# Patient Record
Sex: Female | Born: 1960 | Race: White | Hispanic: No | Marital: Married | Smoking: Never smoker
Health system: Southern US, Community
[De-identification: ages and names within clinical notes are randomized; demographics above are authoritative.]

## PROBLEM LIST (undated history)

## (undated) DIAGNOSIS — I10 Essential (primary) hypertension: Secondary | ICD-10-CM

## (undated) HISTORY — DX: Essential (primary) hypertension: I10

## (undated) HISTORY — PX: POLYPECTOMY: SHX149

## (undated) HISTORY — PX: BREAST BIOPSY: SHX20

---

## 2012-12-09 LAB — HM PAP SMEAR: HM Pap smear: NORMAL

## 2012-12-09 LAB — HM MAMMOGRAPHY: HM Mammogram: NORMAL

## 2012-12-22 ENCOUNTER — Encounter: Payer: Self-pay | Admitting: Gastroenterology

## 2012-12-25 ENCOUNTER — Other Ambulatory Visit: Payer: Self-pay | Admitting: Obstetrics and Gynecology

## 2012-12-25 DIAGNOSIS — R928 Other abnormal and inconclusive findings on diagnostic imaging of breast: Secondary | ICD-10-CM

## 2013-01-05 ENCOUNTER — Ambulatory Visit
Admission: RE | Admit: 2013-01-05 | Discharge: 2013-01-05 | Disposition: A | Discharge status: Home / Self Care | Payer: BC Managed Care – PPO | Source: Ambulatory Visit | Attending: Obstetrics and Gynecology | Admitting: Obstetrics and Gynecology

## 2013-01-05 ENCOUNTER — Other Ambulatory Visit: Payer: Self-pay | Admitting: Obstetrics and Gynecology

## 2013-01-05 DIAGNOSIS — R928 Other abnormal and inconclusive findings on diagnostic imaging of breast: Secondary | ICD-10-CM

## 2013-01-08 ENCOUNTER — Ambulatory Visit
Admission: RE | Admit: 2013-01-08 | Discharge: 2013-01-08 | Disposition: A | Discharge status: Home / Self Care | Payer: BC Managed Care – PPO | Source: Ambulatory Visit | Attending: Obstetrics and Gynecology | Admitting: Obstetrics and Gynecology

## 2013-01-08 ENCOUNTER — Other Ambulatory Visit: Payer: Self-pay | Admitting: Obstetrics and Gynecology

## 2013-01-08 DIAGNOSIS — R928 Other abnormal and inconclusive findings on diagnostic imaging of breast: Secondary | ICD-10-CM

## 2013-01-14 ENCOUNTER — Encounter: Payer: Self-pay | Admitting: Gastroenterology

## 2013-01-14 ENCOUNTER — Ambulatory Visit (AMBULATORY_SURGERY_CENTER): Payer: BC Managed Care – PPO | Admitting: *Deleted

## 2013-01-14 VITALS — Ht 63.25 in | Wt 176.0 lb

## 2013-01-14 DIAGNOSIS — Z1211 Encounter for screening for malignant neoplasm of colon: Secondary | ICD-10-CM

## 2013-01-14 MED ORDER — PEG-KCL-NACL-NASULF-NA ASC-C 100 G PO SOLR: ORAL | Status: DC

## 2013-01-14 NOTE — Progress Notes (Signed)
No egg or soy allergy 

## 2013-01-28 ENCOUNTER — Ambulatory Visit (AMBULATORY_SURGERY_CENTER): Payer: BC Managed Care – PPO | Admitting: Gastroenterology

## 2013-01-28 ENCOUNTER — Encounter: Payer: Self-pay | Admitting: Gastroenterology

## 2013-01-28 VITALS — BP 122/75 | HR 66 | Temp 97.7°F | Resp 15 | Ht 63.0 in | Wt 176.0 lb

## 2013-01-28 DIAGNOSIS — Z1211 Encounter for screening for malignant neoplasm of colon: Secondary | ICD-10-CM

## 2013-01-28 DIAGNOSIS — D126 Benign neoplasm of colon, unspecified: Secondary | ICD-10-CM

## 2013-01-28 HISTORY — PX: COLONOSCOPY: SHX174

## 2013-01-28 MED ORDER — SODIUM CHLORIDE 0.9 % IV SOLN: 500.0000 mL | INTRAVENOUS | Status: DC

## 2013-01-28 NOTE — Op Note (Signed)
[facility] [address] [place], [address]   COLONOSCOPY PROCEDURE REPORT  PATIENT: Jody Thompson, Jody Thompson  MR#: [id number] BIRTHDATE: Mar 07, 1961 , 51  yrs. old GENDER: Female ENDOSCOPIST: Meryl Dare, MD, [facility] REFERRED FT:[id number] Renaldo Fiddler, M.D. PROCEDURE DATE:  01/28/2013 PROCEDURE:   Colonoscopy with snare polypectomy ASA CLASS:   Class II INDICATIONS:average risk screening. MEDICATIONS: MAC sedation, administered by CRNA and propofol (Diprivan) 300mg  IV DESCRIPTION OF PROCEDURE:   After the risks benefits and alternatives of the procedure were thoroughly explained, informed consent was obtained.  A digital rectal exam revealed no abnormalities of the rectum.   The LB CF-[id number]  endoscope was introduced through the anus and advanced to the cecum, which was identified by both the appendix and ileocecal valve. No adverse events experienced.   The quality of the prep was excellent, using MoviPrep  The instrument was then slowly withdrawn as the colon was fully examined.  COLON FINDINGS: A sessile polyp measuring 6 mm in size was found in the transverse colon.  A polypectomy was performed with a cold snare.  The resection was complete and the polyp tissue was completely retrieved.   The colon was otherwise normal.  There was no diverticulosis, inflammation, polyps or cancers unless previously stated.  Retroflexed views revealed small internal hemorrhoids. The time to cecum=3 minutes 10 seconds.  Withdrawal time=9 minutes 05 seconds.  The scope was withdrawn and the procedure completed.  COMPLICATIONS: There were no complications.  ENDOSCOPIC IMPRESSION: 1.   Sessile polyp measuring 6 mm in the transverse colon; polypectomy performed with a cold snare 2.   Small internal hemorrhoids  RECOMMENDATIONS: 1.  Await pathology results 2.  Repeat colonoscopy in 5 years if polyp adenomatous; otherwise 10 years   eSigned:  Meryl Dare, MD, [facility]  01/28/2013 11:31 AM

## 2013-01-28 NOTE — Progress Notes (Signed)
Patient did not experience any of the following events: a burn prior to discharge; a fall within the facility; wrong site/side/patient/procedure/implant event; or a hospital transfer or hospital admission upon discharge from the facility. (G8907) Patient did not have preoperative order for IV antibiotic SSI prophylaxis. (G8918)  

## 2013-01-28 NOTE — Patient Instructions (Addendum)
Findings:  Polyp, Small internal hemorrhoids Recommendations:  Repeat colonoscopy in 5-10 years depending on pathology.  YOU HAD AN ENDOSCOPIC PROCEDURE TODAY AT THE [facility]: Refer to the procedure report that was given to you for any specific questions about what was found during the examination.  If the procedure report does not answer your questions, please call your gastroenterologist to clarify.  If you requested that your care partner not be given the details of your procedure findings, then the procedure report has been included in a sealed envelope for you to review at your convenience later.  YOU SHOULD EXPECT: Some feelings of bloating in the abdomen. Passage of more gas than usual.  Walking can help get rid of the air that was put into your GI tract during the procedure and reduce the bloating. If you had a lower endoscopy (such as a colonoscopy or flexible sigmoidoscopy) you may notice spotting of blood in your stool or on the toilet paper. If you underwent a bowel prep for your procedure, then you may not have a normal bowel movement for a few days.  DIET: Your first meal following the procedure should be a light meal and then it is ok to progress to your normal diet.  A half-sandwich or bowl of soup is an example of a good first meal.  Heavy or fried foods are harder to digest and may make you feel nauseous or bloated.  Likewise meals heavy in dairy and vegetables can cause extra gas to form and this can also increase the bloating.  Drink plenty of fluids but you should avoid alcoholic beverages for 24 hours.  ACTIVITY: Your care partner should take you home directly after the procedure.  You should plan to take it easy, moving slowly for the rest of the day.  You can resume normal activity the day after the procedure however you should NOT DRIVE or use heavy machinery for 24 hours (because of the sedation medicines used during the test).    SYMPTOMS TO REPORT  IMMEDIATELY: A gastroenterologist can be reached at any hour.  During normal business hours, 8:30 AM to 5:00 PM Monday through Friday, call [phone].  After hours and on weekends, please call the GI answering service at [phone] who will take a message and have the physician on call contact you.   Following lower endoscopy (colonoscopy or flexible sigmoidoscopy):  Excessive amounts of blood in the stool  Significant tenderness or worsening of abdominal pains  Swelling of the abdomen that is new, acute  Fever of 100F or higher  Following upper endoscopy (EGD)  Vomiting of blood or coffee ground material  New chest pain or pain under the shoulder blades  Painful or persistently difficult swallowing  New shortness of breath  Fever of 100F or higher  Black, tarry-looking stools  FOLLOW UP: If any biopsies were taken you will be contacted by phone or by letter within the next 1-3 weeks.  Call your gastroenterologist if you have not heard about the biopsies in 3 weeks.  Our staff will call the home number listed on your records the next business day following your procedure to check on you and address any questions or concerns that you may have at that time regarding the information given to you following your procedure. This is a courtesy call and so if there is no answer at the home number and we have not heard from you through the emergency physician on call, we will assume  that you have returned to your regular daily activities without incident.  SIGNATURES/CONFIDENTIALITY: You and/or your care partner have signed paperwork which will be entered into your electronic medical record.  These signatures attest to the fact that that the information above on your After Visit Summary has been reviewed and is understood.  Full responsibility of the confidentiality of this discharge information lies with you and/or your care-partner.  Please follow all discharge instructions given to you  by the recovery room nurse. If you have any questions or problems after discharge please call one of the numbers listed above. You will receive a phone call in the am to see how you are doing and answer any questions you may have. Thank you for choosing [facility] for your health care needs.

## 2013-01-29 ENCOUNTER — Telehealth: Payer: Self-pay | Admitting: *Deleted

## 2013-01-29 NOTE — Telephone Encounter (Signed)
  Follow up Call-  Call back number 01/28/2013  Post procedure Call Back phone  # [phone]  Permission to leave phone message Yes     Patient questions:  Do you have a fever, pain , or abdominal swelling? no Pain Score  0 *  Have you tolerated food without any problems? yes  Have you been able to return to your normal activities? yes  Do you have any questions about your discharge instructions: Diet   no Medications  no Follow up visit  no  Do you have questions or concerns about your Care? no  Actions: * If pain score is 4 or above: No action needed, pain <4.

## 2013-02-03 ENCOUNTER — Encounter: Payer: Self-pay | Admitting: Gastroenterology

## 2013-11-17 ENCOUNTER — Ambulatory Visit (INDEPENDENT_AMBULATORY_CARE_PROVIDER_SITE_OTHER): Payer: BC Managed Care – PPO | Admitting: Internal Medicine

## 2013-11-17 ENCOUNTER — Encounter: Payer: Self-pay | Admitting: Internal Medicine

## 2013-11-17 VITALS — BP 126/84 | HR 80 | Temp 98.2°F | Ht 66.0 in | Wt 172.8 lb

## 2013-11-17 DIAGNOSIS — Z23 Encounter for immunization: Secondary | ICD-10-CM

## 2013-11-17 DIAGNOSIS — I10 Essential (primary) hypertension: Secondary | ICD-10-CM | POA: Insufficient documentation

## 2013-11-17 DIAGNOSIS — Z0000 Encounter for general adult medical examination without abnormal findings: Secondary | ICD-10-CM

## 2013-11-17 DIAGNOSIS — R030 Elevated blood-pressure reading, without diagnosis of hypertension: Secondary | ICD-10-CM

## 2013-11-17 NOTE — Patient Instructions (Addendum)
It was good to see you today.  We have reviewed your prior records including labs and tests today  Health Maintenance reviewed - Tdap ( tetanus, diphtheria and pertussis) immunization updated today -consider flu vaccination annually -all other recommended immunizations and age-appropriate screenings are up-to-date.  Test(s) ordered today. Your results will be released to MyChart (or called to you) after review, usually within 72hours after test completion. If any changes need to be made, you will be notified at that same time.  Medications reviewed and updated, no changes recommended at this time.  Work on lifestyle changes as discussed (low fat, low carb, increased protein diet; improved exercise efforts; weight loss) to control sugar, blood pressure and cholesterol levels and/or reduce risk of developing other medical problems. Look into [email or web address] or other type of food journal to assist you in this process.  Please schedule followup in 12-24 months for annual exam, call sooner if problems.  Health Maintenance, Female A healthy lifestyle and preventative care can promote health and wellness.  Maintain regular health, dental, and eye exams.  Eat a healthy diet. Foods like vegetables, fruits, whole grains, low-fat dairy products, and lean protein foods contain the nutrients you need without too many calories. Decrease your intake of foods high in solid fats, added sugars, and salt. Get information about a proper diet from your caregiver, if necessary.  Regular physical exercise is one of the most important things you can do for your health. Most adults should get at least 150 minutes of moderate-intensity exercise (any activity that increases your heart rate and causes you to sweat) each week. In addition, most adults need muscle-strengthening exercises on 2 or more days a week.   Maintain a healthy weight. The body mass index (BMI) is a screening tool to identify possible weight  problems. It provides an estimate of body fat based on height and weight. Your caregiver can help determine your BMI, and can help you achieve or maintain a healthy weight. For adults 20 years and older:  A BMI below 18.5 is considered underweight.  A BMI of 18.5 to 24.9 is normal.  A BMI of 25 to 29.9 is considered overweight.  A BMI of 30 and above is considered obese.  Maintain normal blood lipids and cholesterol by exercising and minimizing your intake of saturated fat. Eat a balanced diet with plenty of fruits and vegetables. Blood tests for lipids and cholesterol should begin at age 42 and be repeated every 5 years. If your lipid or cholesterol levels are high, you are over 50, or you are a high risk for heart disease, you may need your cholesterol levels checked more frequently.Ongoing high lipid and cholesterol levels should be treated with medicines if diet and exercise are not effective.  If you smoke, find out from your caregiver how to quit. If you do not use tobacco, do not start.  Lung cancer screening is recommended for adults aged 33 80 years who are at high risk for developing lung cancer because of a history of smoking. Yearly low-dose computed tomography (CT) is recommended for people who have at least a 30-pack-year history of smoking and are a current smoker or have quit within the past 15 years. A pack year of smoking is smoking an average of 1 pack of cigarettes a day for 1 year (for example: 1 pack a day for 30 years or 2 packs a day for 15 years). Yearly screening should continue until the smoker has stopped smoking for at least  15 years. Yearly screening should also be stopped for people who develop a health problem that would prevent them from having lung cancer treatment.  If you are pregnant, do not drink alcohol. If you are breastfeeding, be very cautious about drinking alcohol. If you are not pregnant and choose to drink alcohol, do not exceed 1 drink per day. One  drink is considered to be 12 ounces (355 mL) of beer, 5 ounces (148 mL) of wine, or 1.5 ounces (44 mL) of liquor.  Avoid use of street drugs. Do not share needles with anyone. Ask for help if you need support or instructions about stopping the use of drugs.  High blood pressure causes heart disease and increases the risk of stroke. Blood pressure should be checked at least every 1 to 2 years. Ongoing high blood pressure should be treated with medicines, if weight loss and exercise are not effective.  If you are 48 to 51 years old, ask your caregiver if you should take aspirin to prevent strokes.  Diabetes screening involves taking a blood sample to check your fasting blood sugar level. This should be done once every 3 years, after age 65, if you are within normal weight and without risk factors for diabetes. Testing should be considered at a younger age or be carried out more frequently if you are overweight and have at least 1 risk factor for diabetes.  Breast cancer screening is essential preventative care for women. You should practice "breast self-awareness." This means understanding the normal appearance and feel of your breasts and may include breast self-examination. Any changes detected, no matter how small, should be reported to a caregiver. Women in their 50s and 30s should have a clinical breast exam (CBE) by a caregiver as part of a regular health exam every 1 to 3 years. After age 96, women should have a CBE every year. Starting at age 30, women should consider having a mammogram (breast X-ray) every year. Women who have a family history of breast cancer should talk to their caregiver about genetic screening. Women at a high risk of breast cancer should talk to their caregiver about having an MRI and a mammogram every year.  Breast cancer gene (BRCA)-related cancer risk assessment is recommended for women who have family members with BRCA-related cancers. BRCA-related cancers include breast,  ovarian, tubal, and peritoneal cancers. Having family members with these cancers may be associated with an increased risk for harmful changes (mutations) in the breast cancer genes BRCA1 and BRCA2. Results of the assessment will determine the need for genetic counseling and BRCA1 and BRCA2 testing.  The Pap test is a screening test for cervical cancer. Women should have a Pap test starting at age 11. Between ages 78 and 43, Pap tests should be repeated every 2 years. Beginning at age 4, you should have a Pap test every 3 years as long as the past 3 Pap tests have been normal. If you had a hysterectomy for a problem that was not cancer or a condition that could lead to cancer, then you no longer need Pap tests. If you are between ages 47 and 18, and you have had normal Pap tests going back 10 years, you no longer need Pap tests. If you have had past treatment for cervical cancer or a condition that could lead to cancer, you need Pap tests and screening for cancer for at least 20 years after your treatment. If Pap tests have been discontinued, risk factors (such as a new  sexual partner) need to be reassessed to determine if screening should be resumed. Some women have medical problems that increase the chance of getting cervical cancer. In these cases, your caregiver may recommend more frequent screening and Pap tests.  The human papillomavirus (HPV) test is an additional test that may be used for cervical cancer screening. The HPV test looks for the virus that can cause the cell changes on the cervix. The cells collected during the Pap test can be tested for HPV. The HPV test could be used to screen women aged 24 years and older, and should be used in women of any age who have unclear Pap test results. After the age of 12, women should have HPV testing at the same frequency as a Pap test.  Colorectal cancer can be detected and often prevented. Most routine colorectal cancer screening begins at the age of 52  and continues through age 20. However, your caregiver may recommend screening at an earlier age if you have risk factors for colon cancer. On a yearly basis, your caregiver may provide home test kits to check for hidden blood in the stool. Use of a small camera at the end of a tube, to directly examine the colon (sigmoidoscopy or colonoscopy), can detect the earliest forms of colorectal cancer. Talk to your caregiver about this at age 24, when routine screening begins. Direct examination of the colon should be repeated every 5 to 10 years through age 4, unless early forms of pre-cancerous polyps or small growths are found.  Hepatitis C blood testing is recommended for all people born from [birth year] through 1964 and any individual with known risks for hepatitis C.  Practice safe sex. Use condoms and avoid high-risk sexual practices to reduce the spread of sexually transmitted infections (STIs). Sexually active women aged 26 and younger should be checked for Chlamydia, which is a common sexually transmitted infection. Older women with new or multiple partners should also be tested for Chlamydia. Testing for other STIs is recommended if you are sexually active and at increased risk.  Osteoporosis is a disease in which the bones lose minerals and strength with aging. This can result in serious bone fractures. The risk of osteoporosis can be identified using a bone density scan. Women ages 5 and over and women at risk for fractures or osteoporosis should discuss screening with their caregivers. Ask your caregiver whether you should be taking a calcium supplement or vitamin D to reduce the rate of osteoporosis.  Menopause can be associated with physical symptoms and risks. Hormone replacement therapy is available to decrease symptoms and risks. You should talk to your caregiver about whether hormone replacement therapy is right for you.  Use sunscreen. Apply sunscreen liberally and repeatedly throughout the day.  You should seek shade when your shadow is shorter than you. Protect yourself by wearing long sleeves, pants, a wide-brimmed hat, and sunglasses year round, whenever you are outdoors.  Notify your caregiver of new moles or changes in moles, especially if there is a change in shape or color. Also notify your caregiver if a mole is larger than the size of a pencil eraser.  Stay current with your immunizations. Document Released: 06/10/2011 Document Revised: 03/22/2013 Document Reviewed: 06/10/2011 [facility] Patient Information 2014 [place], [place].

## 2013-11-17 NOTE — Progress Notes (Signed)
Subjective:    Patient ID: Jody Thompson, female    DOB: 28-May-1961, 52 y.o.   MRN: [id number]  HPI  New patient to me, here to establish primary care patient is here today for annual physical. Patient feels well and has no complaints.  History reviewed. No pertinent past medical history.  Family History  Problem Relation Age of Onset  . Colon cancer Paternal Grandmother   . Esophageal cancer Neg Hx   . Stomach cancer Neg Hx   . Rectal cancer Neg Hx   . Arthritis Mother   . Heart disease Mother   . Hypertension Father   . Breast cancer Maternal Aunt    History  Substance Use Topics  . Smoking status: Never Smoker   . Smokeless tobacco: Never Used  . Alcohol Use: 4.2 oz/week    7 Glasses of wine per week    Review of Systems  Constitutional: Negative for fatigue and unexpected weight change.  Respiratory: Negative for cough, shortness of breath and wheezing.   Cardiovascular: Negative for chest pain, palpitations and leg swelling.  Gastrointestinal: Negative for nausea, abdominal pain and diarrhea.  Neurological: Negative for dizziness, weakness, light-headedness and headaches.  Psychiatric/Behavioral: Negative for dysphoric mood. The patient is not nervous/anxious.   All other systems reviewed and are negative.       Objective:   Physical Exam BP 126/84  Pulse 80  Temp(Src) 98.2 F (36.8 C) (Oral)  Ht 5\' 6"  (1.676 m)  Wt 172 lb 12.8 oz (78.382 kg)  BMI 27.90 kg/m2  SpO2 96% Wt Readings from Last 3 Encounters:  11/17/13 172 lb 12.8 oz (78.382 kg)  01/28/13 176 lb (79.833 kg)  01/14/13 176 lb (79.833 kg)   Constitutional: She appears well-developed and well-nourished. No distress.  HENT: Head: Normocephalic and atraumatic. Ears: B TMs ok, no erythema or effusion; Nose: Nose normal. Mouth/Throat: Oropharynx is clear and moist. No oropharyngeal exudate.  Eyes: Conjunctivae and EOM are normal. Pupils are equal, round, and reactive to light. No scleral  icterus.  Neck: Normal range of motion. Neck supple. No JVD present. No thyromegaly present.  Cardiovascular: Normal rate, regular rhythm and normal heart sounds.  No murmur heard. No BLE edema. Pulmonary/Chest: Effort normal and breath sounds normal. No respiratory distress. She has no wheezes.  Abdominal: Soft. Bowel sounds are normal. She exhibits no distension. There is no tenderness. no masses Musculoskeletal: Normal range of motion, no joint effusions. No gross deformities Neurological: She is alert and oriented to person, place, and time. No cranial nerve deficit. Coordination, balance, strength, speech and gait are normal.  Skin: Skin is warm and dry. No rash noted. No erythema.  Psychiatric: She has a normal mood and affect. Her behavior is normal. Judgment and thought content normal.   No results found for this basename: WBC,  HGB,  HCT,  PLT,  GLUCOSE,  CHOL,  TRIG,  HDL,  LDLDIRECT,  LDLCALC,  ALT,  AST,  NA,  K,  CL,  CREATININE,  BUN,  CO2,  TSH,  PSA,  INR,  GLUF,  HGBA1C,  MICROALBUR   ECG: normal sinus at 89 beats per minute. No arrhythmia or ischemic change     Assessment & Plan:   CPX/v70.0 - Patient has been counseled on age-appropriate routine health concerns for screening and prevention. These are reviewed and up-to-date. Immunizations are up-to-date or declined. Labs and ECG reviewed.  ?whitecoat hypertension -initial blood pressure 162/90 normalized to 126/84 on manual recheck. Reviewed influence of weight  gain and sodium on blood pressure elevation. Also reviewed syndrome of whitecoat hypertension. Patient will monitor at home and call if systolic pressure remains consistently 140 or greater. Aerobic exercise, weight reduction and low sodium diet recommended

## 2013-11-17 NOTE — Progress Notes (Signed)
Pre-visit discussion using our clinic review tool. No additional management support is needed unless otherwise documented below in the visit note.  

## 2014-06-01 IMAGING — MG MM DIAGNOSTIC UNILATERAL L
2 series · 2 of 2 positions shown · non-contrast
Comparison: 12/21/2012, 11/19/2010, 11/17/2008

CLINICAL DATA: Further evaluation of left breast asymmetry

DIGITAL DIAGNOSTIC LEFT MAMMOGRAM  AND LEFT BREAST ULTRASOUND:

[L CC]
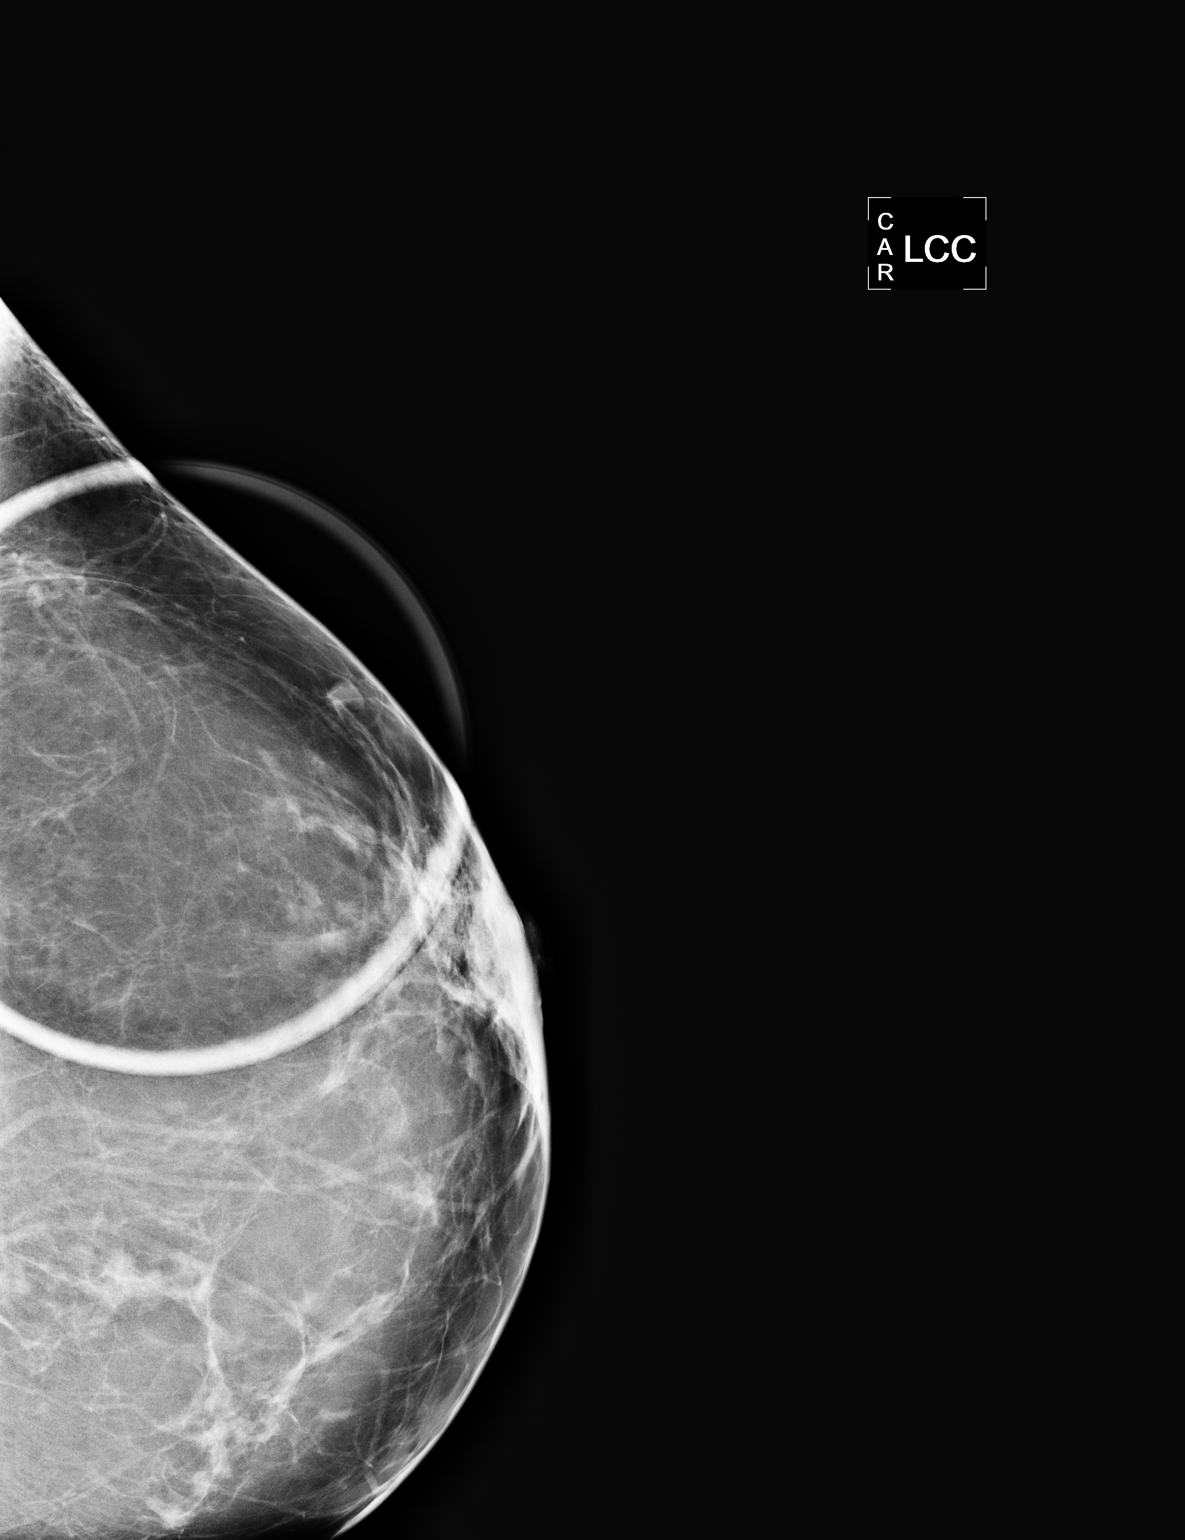

[L MLO]
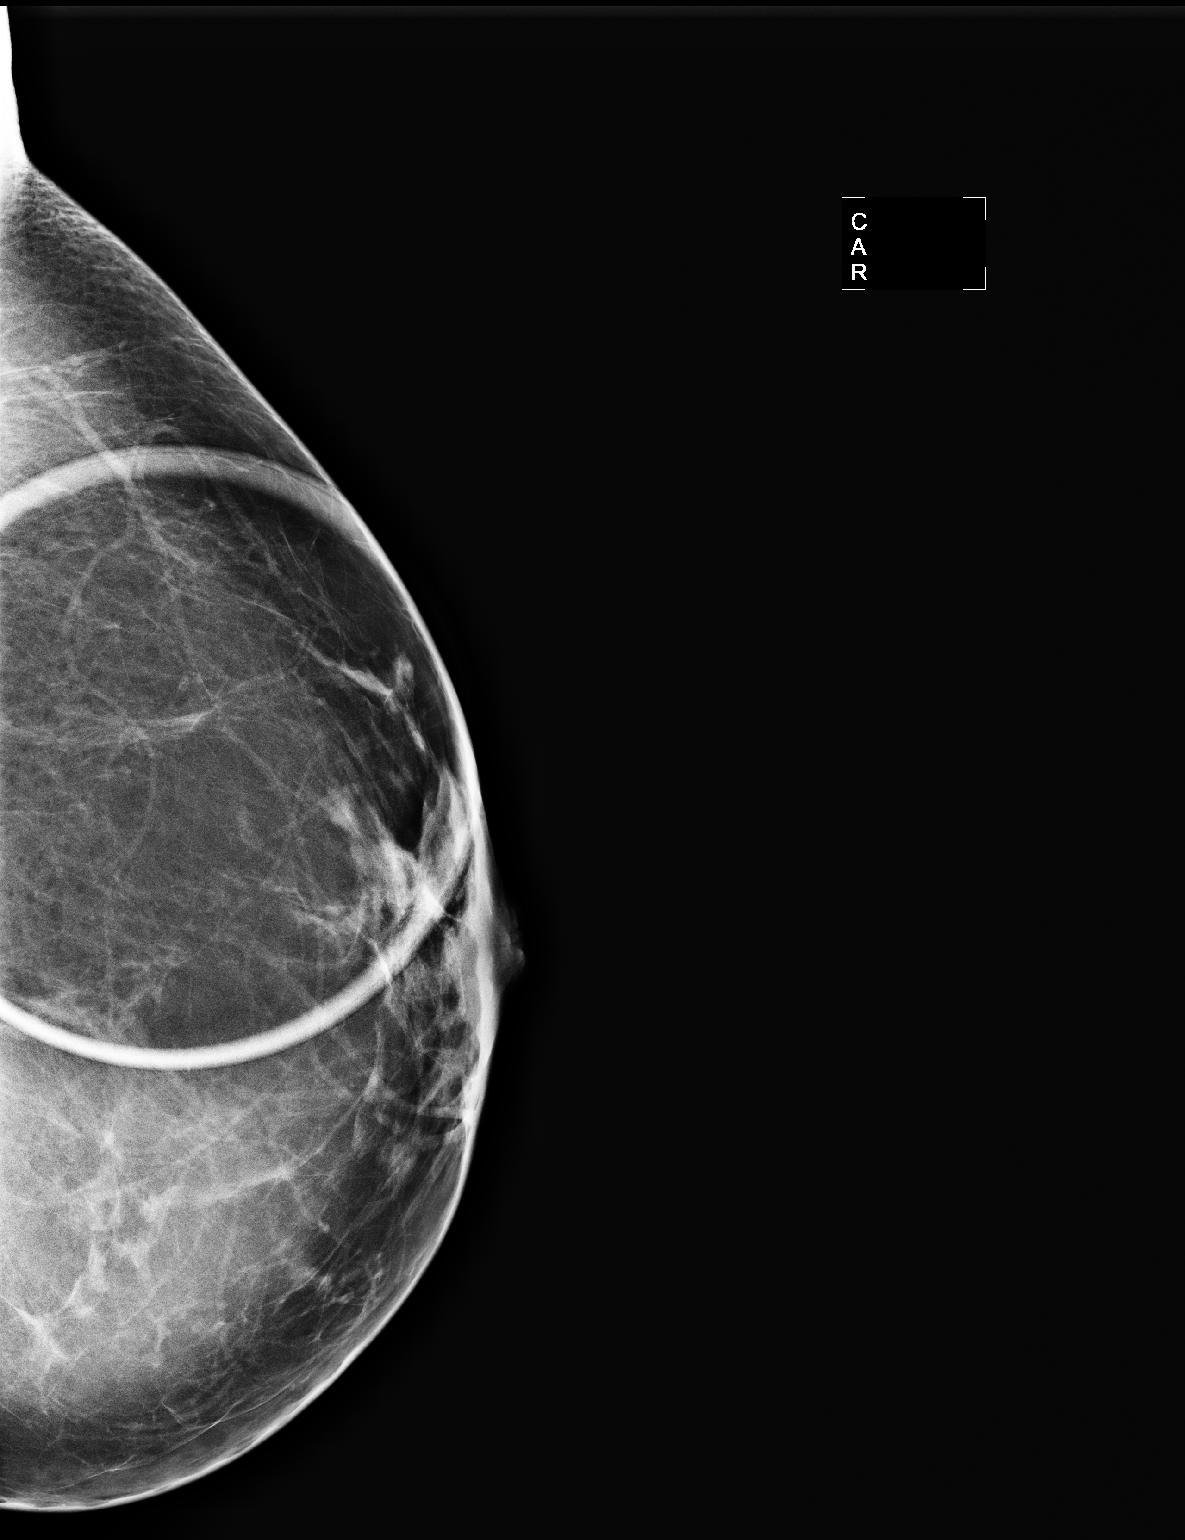

[2 of 2 positions shown; findings below may reference images not displayed]

FINDINGS: ACR Breast Density Category 2: There is a scattered fibroglandular
pattern.

There is a persistent circumscribed oval 5 mm mass anteriorly in
the 2 o'clock position of the left breast.

On physical exam, there are no palpable abnormalities.

Ultrasound is performed, showing an oval hypoechoic mass in the 2
o'clock position of the left breast 2 cm from the nipple.  It is
located superficially within the breast.  It measures 6 x 3 x 5 mm.
It is microlobulated and causes posterior acoustic shadowing.
IMPRESSION: Small left breast mass

BI-RADS CATEGORY 4:  Suspicious abnormality - biopsy should be
considered.

RECOMMENDATION:
The patient is scheduled to return for ultrasound-guided core
needle biopsy.

I have discussed the findings and recommendations with the patient.
Results were also provided in writing at the conclusion of the
visit.

## 2014-06-04 IMAGING — US US BREAST BIOPSY
1 series · 9 of 9 positions shown · non-contrast
Comparison: Previous exams.

***ADDENDUM*** CREATED: 01/11/2013 [DATE]

Pathology revealed benign breast tissue with patchy stromal
hyalinization and pseudoangiomatous stromal hyalinization (PASH) in
the left breast. This was found to be concordant by Dr. Lovesten
Tischer. Pathology was relayed by telephone. The patient reported
minimal tenderness at the biopsy site. Post biopsy instructions
were reviewed and her questions were answered. She was encouraged
to call The [REDACTED] for any additional
concerns. She was asked to return in 6 months for a diagnostic left
breast mammogram and possible ultrasound.
Pathology results are dictated by Edgar Emmanuel Willian RN, BSN on [place]
CLINICAL DATA: Suspicious mass left breast two o'clock location
ULTRASOUND GUIDED VACUUM ASSISTED CORE BIOPSY OF THE LEFT BREAST

[Series 1: us breast biopsy · 9 of 9 slices shown]
[im 1/9]
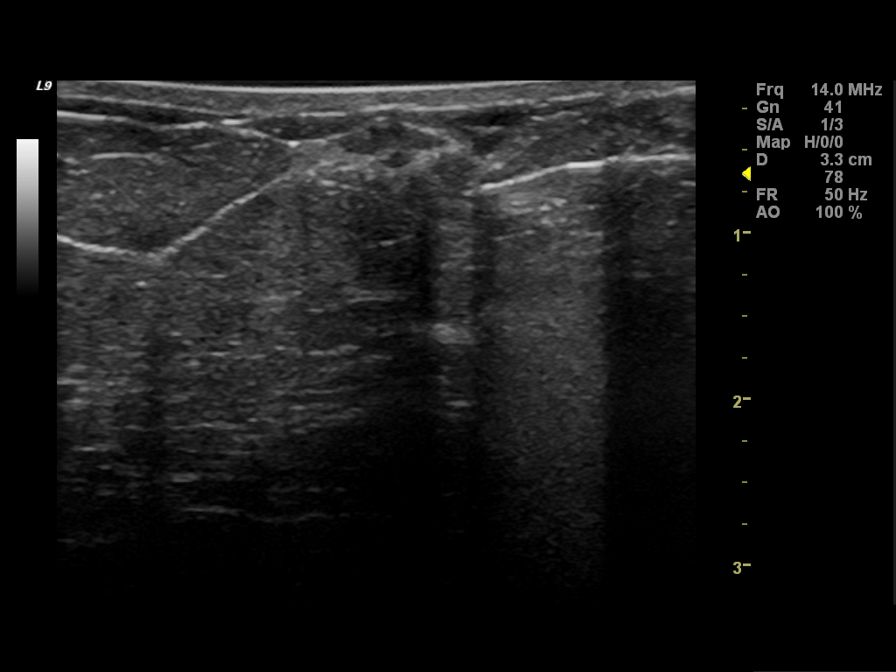
[im 2/9]
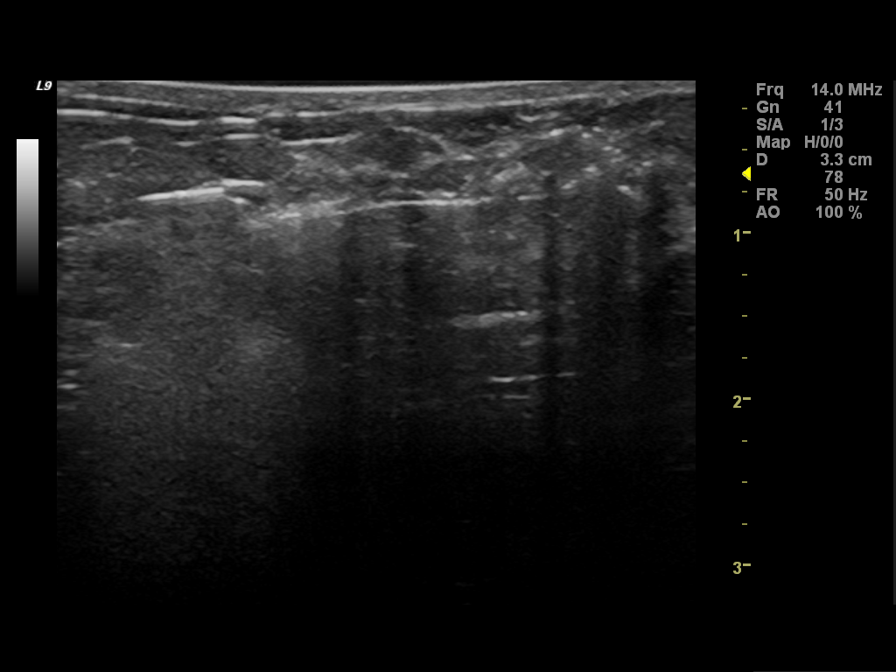
[im 3/9]
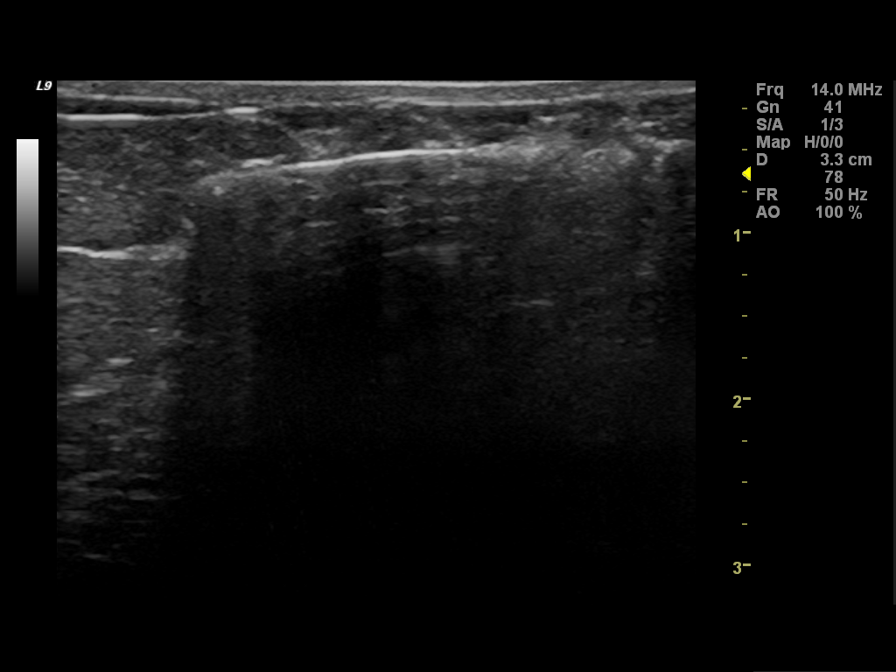
[im 4/9]
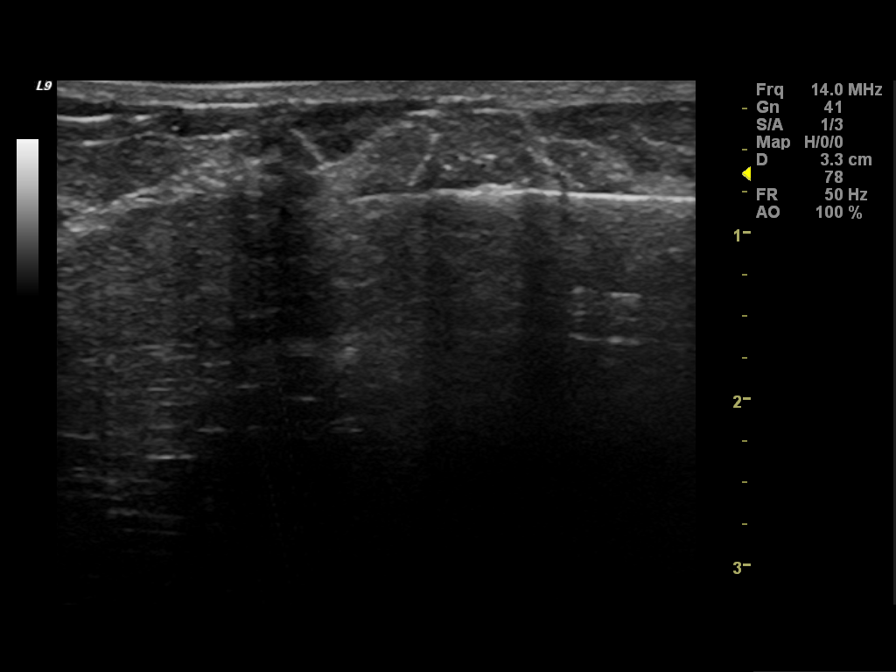
[im 5/9]
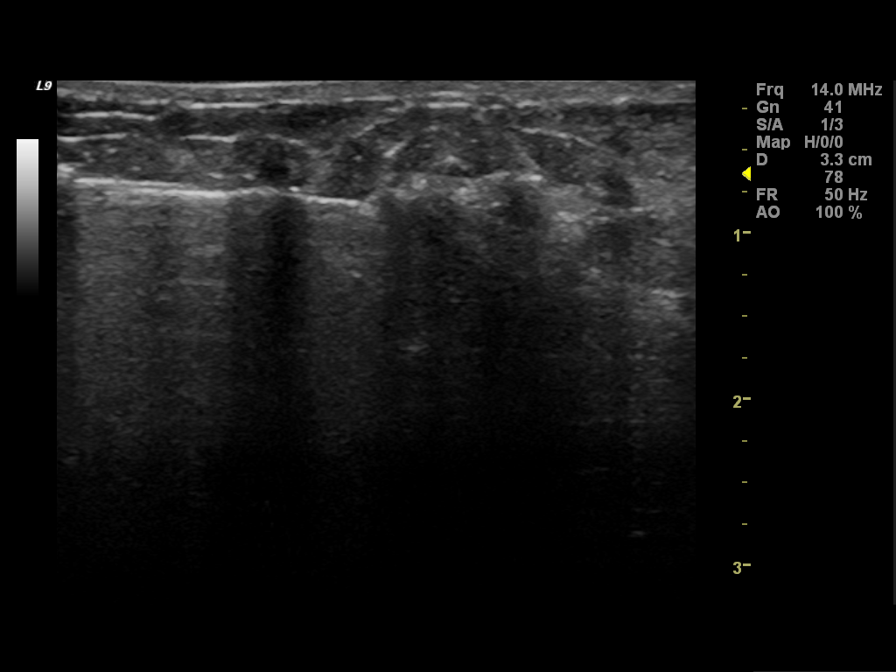
[im 6/9]
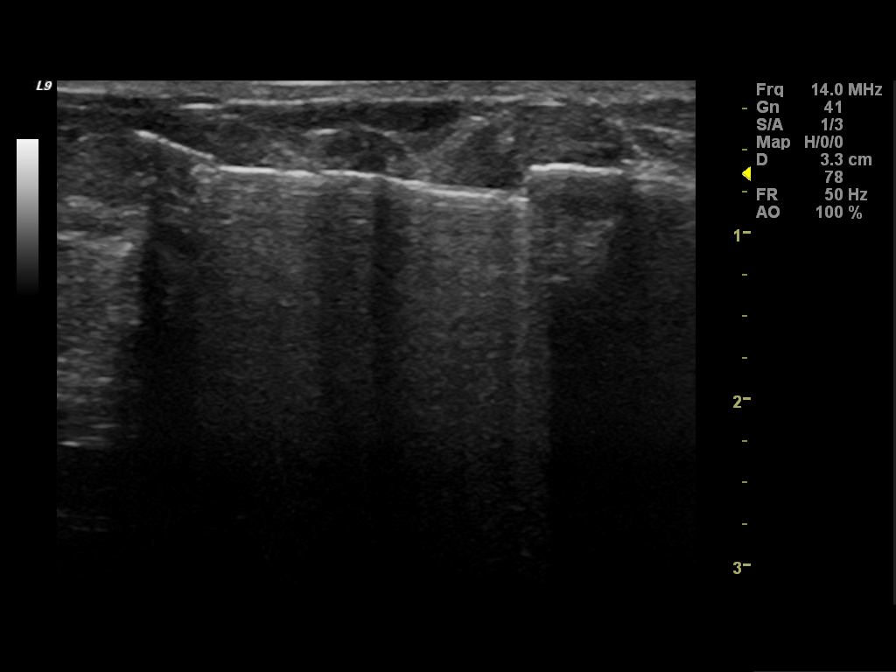
[im 7/9]
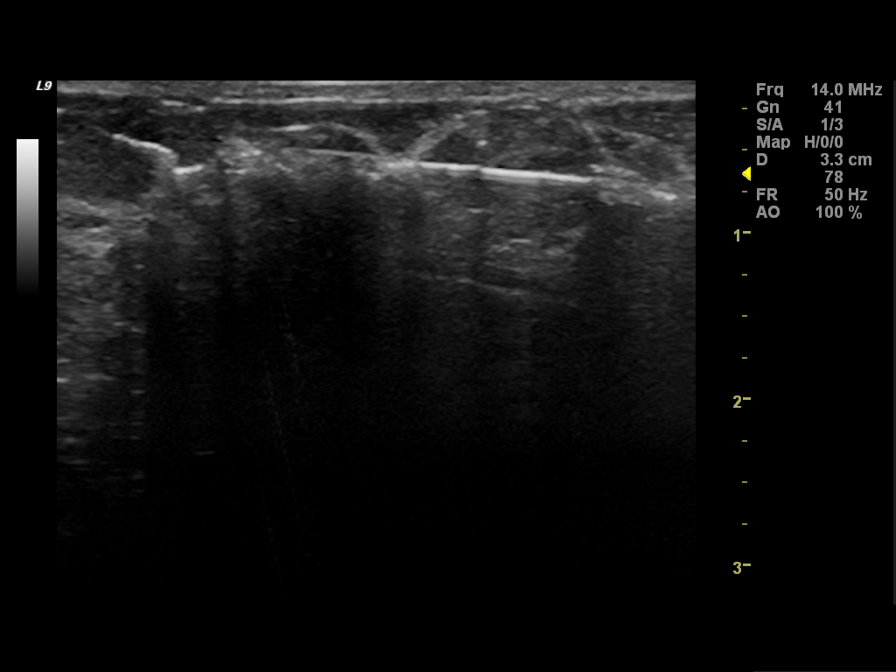
[im 8/9]
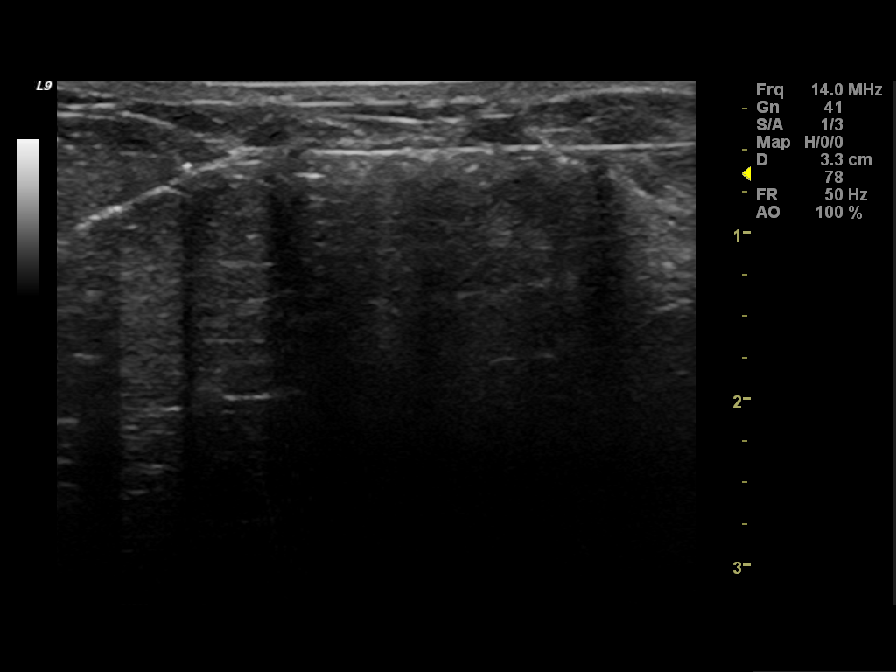
[im 9/9]
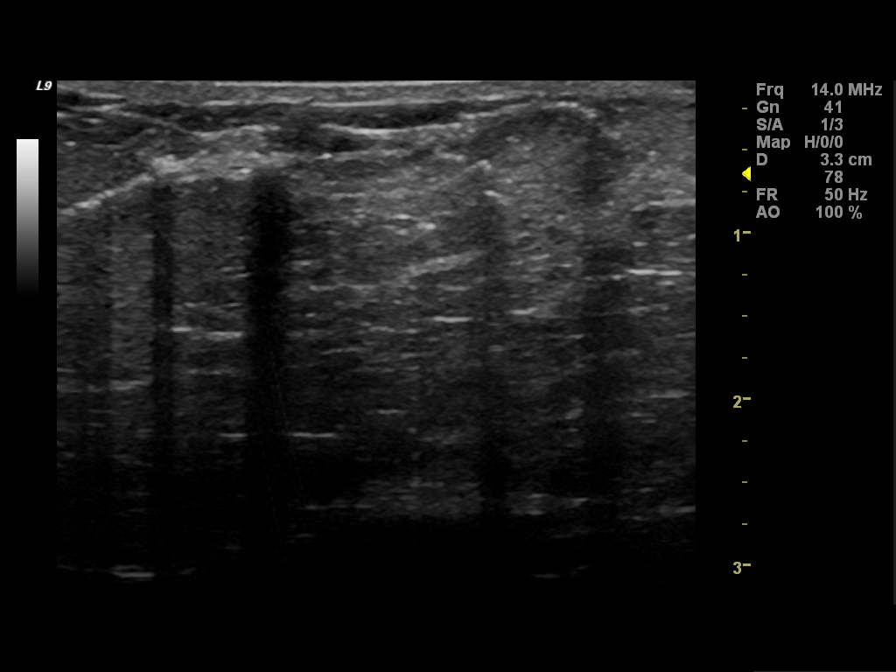

[9 of 9 positions shown; findings below may reference images not displayed]

I met with the patient and we discussed the procedure of ultrasound-
guided biopsy, including benefits and alternatives.  We discussed
the high likelihood of a successful procedure. We discussed the
risks of the procedure including infection, bleeding, tissue
injury, clip migration, and inadequate sampling.  Informed written
consent was given.

Using sterile technique, 2% lidocaine ultrasound guidance and a 12
gauge vacuum assisted needle biopsy was performed of left breast
mass 2 o'clock location using a inferior to superior approach.  At
the conclusion of the procedure, a ribbon shaped tissue marker clip
was deployed into the biopsy cavity.  Follow-up 2-view mammogram
was performed and dictated separately.
IMPRESSION: Ultrasound-guided biopsy of left breast mass 2 o'clock location,
with ribbon shaped clip placement.  Pathology is pending.  No
apparent complications.

## 2014-06-04 IMAGING — MG MM DIAGNOSTIC UNILATERAL L
2 series · 2 of 2 positions shown · non-contrast
Comparison: Previous exams.

CLINICAL DATA: Ultrasound-guided left breast core biopsy two
o'clock location

DIGITAL DIAGNOSTIC LEFT MAMMOGRAM

[L CC]
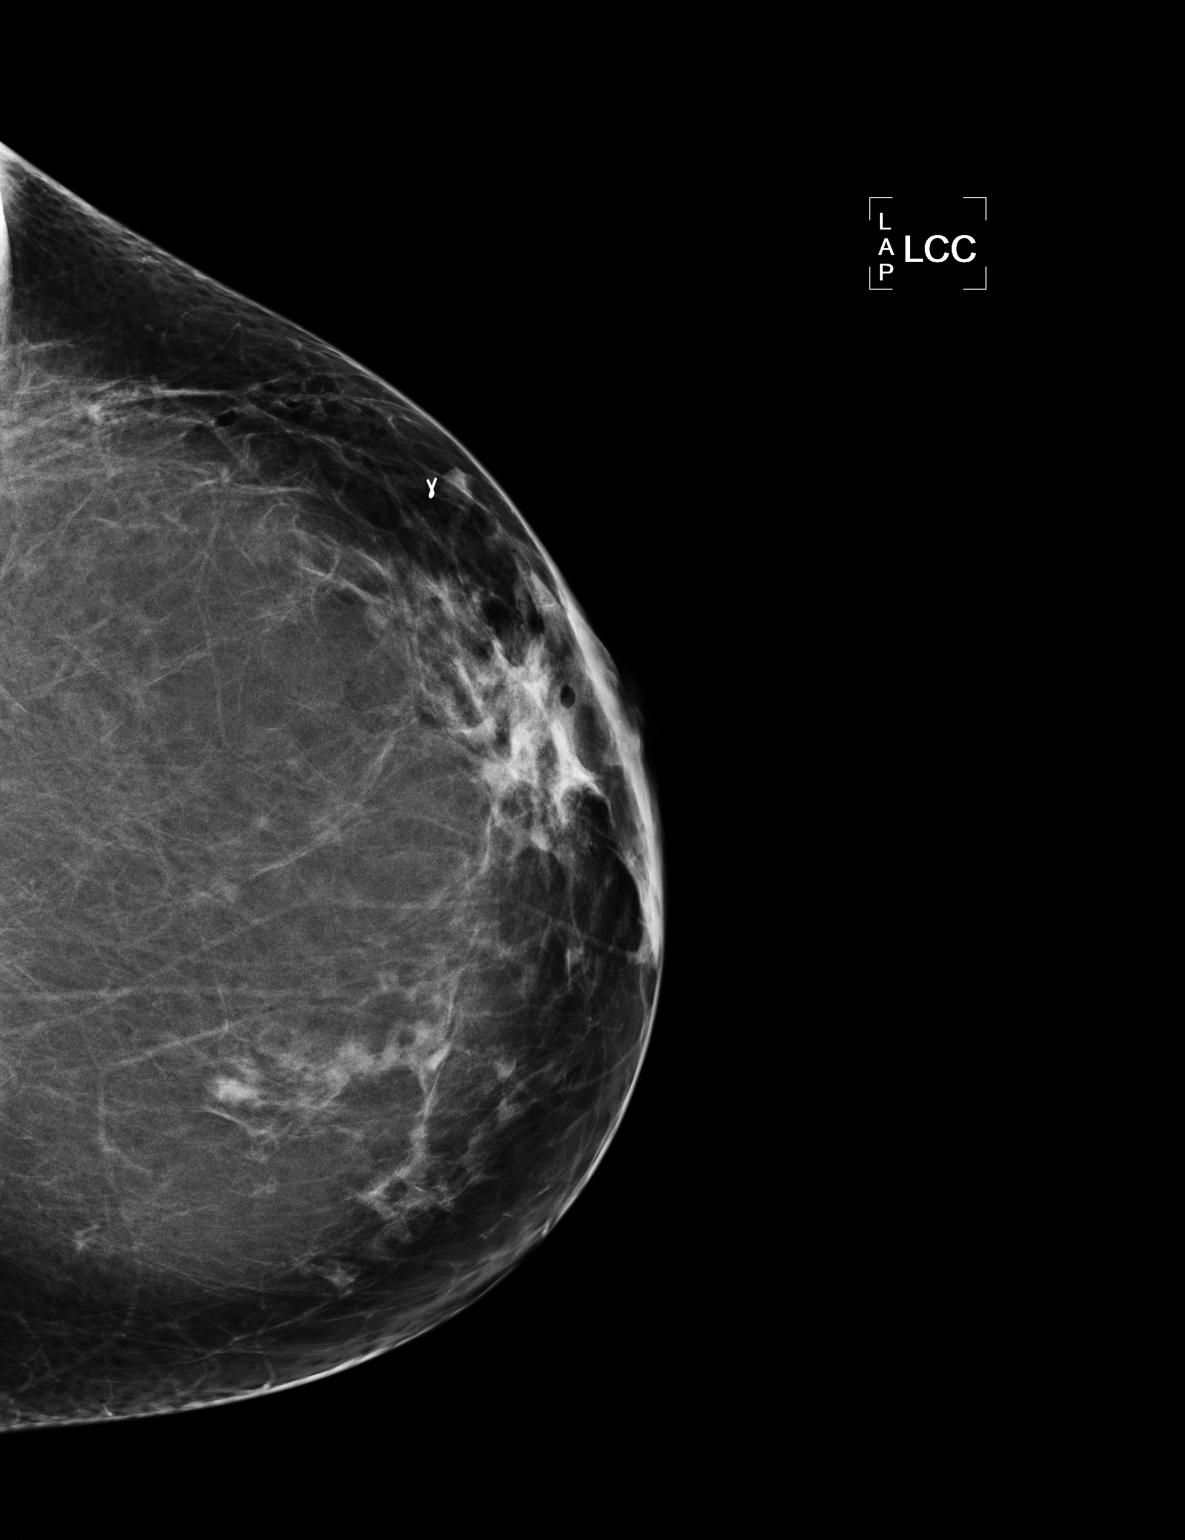

[L ML]
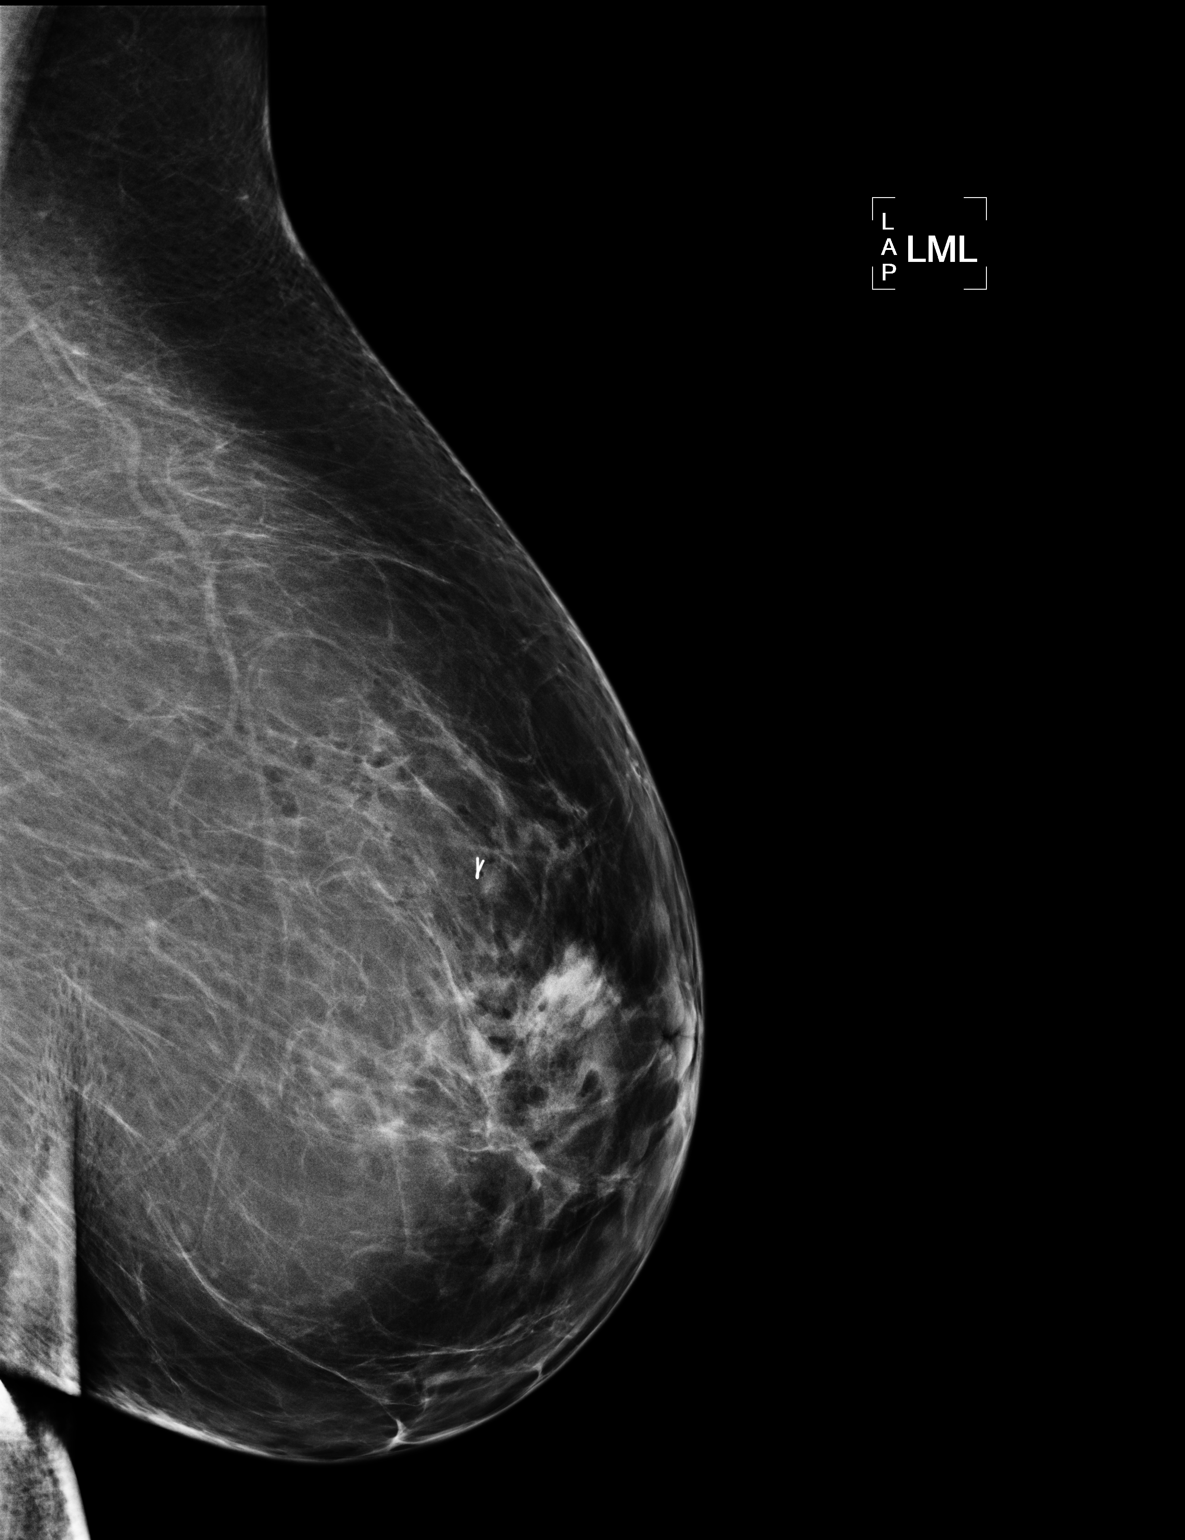

[2 of 2 positions shown; findings below may reference images not displayed]

FINDINGS: Films are performed following ultrasound guided biopsy
of left breast mass 2 o'clock location.  The ribbon shaped clip is
appropriately located in the left breast two o'clock location at
the site of the mammographically evident mass.
IMPRESSION: Appropriate ribbon shaped clip location, left breast two o'clock
location

## 2016-10-09 DIAGNOSIS — Z1231 Encounter for screening mammogram for malignant neoplasm of breast: Secondary | ICD-10-CM | POA: Diagnosis not present

## 2016-10-09 DIAGNOSIS — Z01419 Encounter for gynecological examination (general) (routine) without abnormal findings: Secondary | ICD-10-CM | POA: Diagnosis not present

## 2016-10-09 DIAGNOSIS — Z6833 Body mass index (BMI) 33.0-33.9, adult: Secondary | ICD-10-CM | POA: Diagnosis not present

## 2016-11-07 DIAGNOSIS — I10 Essential (primary) hypertension: Secondary | ICD-10-CM | POA: Diagnosis not present

## 2016-12-12 DIAGNOSIS — I10 Essential (primary) hypertension: Secondary | ICD-10-CM | POA: Diagnosis not present

## 2017-11-12 DIAGNOSIS — Z1231 Encounter for screening mammogram for malignant neoplasm of breast: Secondary | ICD-10-CM | POA: Diagnosis not present

## 2017-11-12 DIAGNOSIS — Z01419 Encounter for gynecological examination (general) (routine) without abnormal findings: Secondary | ICD-10-CM | POA: Diagnosis not present

## 2017-11-12 DIAGNOSIS — Z6833 Body mass index (BMI) 33.0-33.9, adult: Secondary | ICD-10-CM | POA: Diagnosis not present

## 2017-11-13 ENCOUNTER — Telehealth: Payer: Self-pay | Admitting: General Practice

## 2017-11-13 NOTE — Telephone Encounter (Signed)
Copied from [place]. Topic: Appointment Scheduling - Scheduling Inquiry for Clinic >> Nov 13, 2017  2:21 PM Valla Leaver wrote: Reason for CRM: Patient established in 2014 with provider no longer at your office. Her husband is a current patient of Dr. Cathlean Cower. She wants to know if either Dr. Quay Burow or Dr. Sharlet Salina would be willing to take her on as a new patient??

## 2017-11-13 NOTE — Telephone Encounter (Signed)
Please advise 

## 2017-11-14 NOTE — Telephone Encounter (Signed)
I am not taking new patients now, sorry.

## 2017-11-16 NOTE — Telephone Encounter (Signed)
I can accept her

## 2017-12-03 NOTE — Telephone Encounter (Signed)
Patient scheduled with Quay Burow

## 2017-12-28 NOTE — Patient Instructions (Addendum)
Monitor your BP at home - ideal is < 130/80 and it definitely needs to be < 140/90.    All other Health Maintenance issues reviewed.   All recommended immunizations and age-appropriate screenings are up-to-date or discussed.  No immunizations administered today.  An EKG was done today.   Medications reviewed and updated. No changes recommended at this time.   Please followup in one year   Health Maintenance, Female Adopting a healthy lifestyle and getting preventive care can go a long way to promote health and wellness. Talk with your health care provider about what schedule of regular examinations is right for you. This is a good chance for you to check in with your provider about disease prevention and staying healthy. In between checkups, there are plenty of things you can do on your own. Experts have done a lot of research about which lifestyle changes and preventive measures are most likely to keep you healthy. Ask your health care provider for more information. Weight and diet Eat a healthy diet  Be sure to include plenty of vegetables, fruits, low-fat dairy products, and lean protein.  Do not eat a lot of foods high in solid fats, added sugars, or salt.  Get regular exercise. This is one of the most important things you can do for your health. ? Most adults should exercise for at least 150 minutes each week. The exercise should increase your heart rate and make you sweat (moderate-intensity exercise). ? Most adults should also do strengthening exercises at least twice a week. This is in addition to the moderate-intensity exercise.  Maintain a healthy weight  Body mass index (BMI) is a measurement that can be used to identify possible weight problems. It estimates body fat based on height and weight. Your health care provider can help determine your BMI and help you achieve or maintain a healthy weight.  For females 69 years of age and older: ? A BMI below 18.5 is considered  underweight. ? A BMI of 18.5 to 24.9 is normal. ? A BMI of 25 to 29.9 is considered overweight. ? A BMI of 30 and above is considered obese.  Watch levels of cholesterol and blood lipids  You should start having your blood tested for lipids and cholesterol at 56 years of age, then have this test every 5 years.  You may need to have your cholesterol levels checked more often if: ? Your lipid or cholesterol levels are high. ? You are older than 56 years of age. ? You are at high risk for heart disease.  Cancer screening Lung Cancer  Lung cancer screening is recommended for adults 87-8 years old who are at high risk for lung cancer because of a history of smoking.  A yearly low-dose CT scan of the lungs is recommended for people who: ? Currently smoke. ? Have quit within the past 15 years. ? Have at least a 30-pack-year history of smoking. A pack year is smoking an average of one pack of cigarettes a day for 1 year.  Yearly screening should continue until it has been 15 years since you quit.  Yearly screening should stop if you develop a health problem that would prevent you from having lung cancer treatment.  Breast Cancer  Practice breast self-awareness. This means understanding how your breasts normally appear and feel.  It also means doing regular breast self-exams. Let your health care provider know about any changes, no matter how small.  If you are in your 56s or  3s, you should have a clinical breast exam (CBE) by a health care provider every 1-3 years as part of a regular health exam.  If you are 45 or older, have a CBE every year. Also consider having a breast X-ray (mammogram) every year.  If you have a family history of breast cancer, talk to your health care provider about genetic screening.  If you are at high risk for breast cancer, talk to your health care provider about having an MRI and a mammogram every year.  Breast cancer gene (BRCA) assessment is  recommended for women who have family members with BRCA-related cancers. BRCA-related cancers include: ? Breast. ? Ovarian. ? Tubal. ? Peritoneal cancers.  Results of the assessment will determine the need for genetic counseling and BRCA1 and BRCA2 testing.  Cervical Cancer Your health care provider may recommend that you be screened regularly for cancer of the pelvic organs (ovaries, uterus, and vagina). This screening involves a pelvic examination, including checking for microscopic changes to the surface of your cervix (Pap test). You may be encouraged to have this screening done every 3 years, beginning at age 73.  For women ages 80-65, health care providers may recommend pelvic exams and Pap testing every 3 years, or they may recommend the Pap and pelvic exam, combined with testing for human papilloma virus (HPV), every 5 years. Some types of HPV increase your risk of cervical cancer. Testing for HPV may also be done on women of any age with unclear Pap test results.  Other health care providers may not recommend any screening for nonpregnant women who are considered low risk for pelvic cancer and who do not have symptoms. Ask your health care provider if a screening pelvic exam is right for you.  If you have had past treatment for cervical cancer or a condition that could lead to cancer, you need Pap tests and screening for cancer for at least 20 years after your treatment. If Pap tests have been discontinued, your risk factors (such as having a new sexual partner) need to be reassessed to determine if screening should resume. Some women have medical problems that increase the chance of getting cervical cancer. In these cases, your health care provider may recommend more frequent screening and Pap tests.  Colorectal Cancer  This type of cancer can be detected and often prevented.  Routine colorectal cancer screening usually begins at 56 years of age and continues through 56 years of  age.  Your health care provider may recommend screening at an earlier age if you have risk factors for colon cancer.  Your health care provider may also recommend using home test kits to check for hidden blood in the stool.  A small camera at the end of a tube can be used to examine your colon directly (sigmoidoscopy or colonoscopy). This is done to check for the earliest forms of colorectal cancer.  Routine screening usually begins at age 71.  Direct examination of the colon should be repeated every 5-10 years through 56 years of age. However, you may need to be screened more often if early forms of precancerous polyps or small growths are found.  Skin Cancer  Check your skin from head to toe regularly.  Tell your health care provider about any new moles or changes in moles, especially if there is a change in a mole's shape or color.  Also tell your health care provider if you have a mole that is larger than the size of a pencil  eraser.  Always use sunscreen. Apply sunscreen liberally and repeatedly throughout the day.  Protect yourself by wearing long sleeves, pants, a wide-brimmed hat, and sunglasses whenever you are outside.  Heart disease, diabetes, and high blood pressure  High blood pressure causes heart disease and increases the risk of stroke. High blood pressure is more likely to develop in: ? People who have blood pressure in the high end of the normal range (130-139/85-89 mm Hg). ? People who are overweight or obese. ? People who are African American.  If you are 19-37 years of age, have your blood pressure checked every 3-5 years. If you are 75 years of age or older, have your blood pressure checked every year. You should have your blood pressure measured twice-once when you are at a hospital or clinic, and once when you are not at a hospital or clinic. Record the average of the two measurements. To check your blood pressure when you are not at a hospital or clinic, you  can use: ? An automated blood pressure machine at a pharmacy. ? A home blood pressure monitor.  If you are between 27 years and 66 years old, ask your health care provider if you should take aspirin to prevent strokes.  Have regular diabetes screenings. This involves taking a blood sample to check your fasting blood sugar level. ? If you are at a normal weight and have a low risk for diabetes, have this test once every three years after 56 years of age. ? If you are overweight and have a high risk for diabetes, consider being tested at a younger age or more often. Preventing infection Hepatitis B  If you have a higher risk for hepatitis B, you should be screened for this virus. You are considered at high risk for hepatitis B if: ? You were born in a country where hepatitis B is common. Ask your health care provider which countries are considered high risk. ? Your parents were born in a high-risk country, and you have not been immunized against hepatitis B (hepatitis B vaccine). ? You have HIV or AIDS. ? You use needles to inject street drugs. ? You live with someone who has hepatitis B. ? You have had sex with someone who has hepatitis B. ? You get hemodialysis treatment. ? You take certain medicines for conditions, including cancer, organ transplantation, and autoimmune conditions.  Hepatitis C  Blood testing is recommended for: ? Everyone born from [birth year] through 1964. ? Anyone with known risk factors for hepatitis C.  Sexually transmitted infections (STIs)  You should be screened for sexually transmitted infections (STIs) including gonorrhea and chlamydia if: ? You are sexually active and are younger than 56 years of age. ? You are older than 56 years of age and your health care provider tells you that you are at risk for this type of infection. ? Your sexual activity has changed since you were last screened and you are at an increased risk for chlamydia or gonorrhea. Ask your  health care provider if you are at risk.  If you do not have HIV, but are at risk, it may be recommended that you take a prescription medicine daily to prevent HIV infection. This is called pre-exposure prophylaxis (PrEP). You are considered at risk if: ? You are sexually active and do not regularly use condoms or know the HIV status of your partner(s). ? You take drugs by injection. ? You are sexually active with a partner who has HIV.  Talk with your health care provider about whether you are at high risk of being infected with HIV. If you choose to begin PrEP, you should first be tested for HIV. You should then be tested every 3 months for as long as you are taking PrEP. Pregnancy  If you are premenopausal and you may become pregnant, ask your health care provider about preconception counseling.  If you may become pregnant, take 400 to 800 micrograms (mcg) of folic acid every day.  If you want to prevent pregnancy, talk to your health care provider about birth control (contraception). Osteoporosis and menopause  Osteoporosis is a disease in which the bones lose minerals and strength with aging. This can result in serious bone fractures. Your risk for osteoporosis can be identified using a bone density scan.  If you are 36 years of age or older, or if you are at risk for osteoporosis and fractures, ask your health care provider if you should be screened.  Ask your health care provider whether you should take a calcium or vitamin D supplement to lower your risk for osteoporosis.  Menopause may have certain physical symptoms and risks.  Hormone replacement therapy may reduce some of these symptoms and risks. Talk to your health care provider about whether hormone replacement therapy is right for you. Follow these instructions at home:  Schedule regular health, dental, and eye exams.  Stay current with your immunizations.  Do not use any tobacco products including cigarettes, chewing  tobacco, or electronic cigarettes.  If you are pregnant, do not drink alcohol.  If you are breastfeeding, limit how much and how often you drink alcohol.  Limit alcohol intake to no more than 1 drink per day for nonpregnant women. One drink equals 12 ounces of beer, 5 ounces of wine, or 1 ounces of hard liquor.  Do not use street drugs.  Do not share needles.  Ask your health care provider for help if you need support or information about quitting drugs.  Tell your health care provider if you often feel depressed.  Tell your health care provider if you have ever been abused or do not feel safe at home. This information is not intended to replace advice given to you by your health care provider. Make sure you discuss any questions you have with your health care provider. Document Released: 06/10/2011 Document Revised: 05/02/2016 Document Reviewed: 08/29/2015 Elsevier Interactive Patient Education  [employer].

## 2017-12-28 NOTE — Progress Notes (Signed)
Subjective:    Patient ID: Jody Thompson, female    DOB: 03-24-1961, 57 y.o.   MRN: [id number]  HPI She is here to establish with a new pcp.    She follows with gyn annually.  She has no concerns.     Medications and allergies reviewed with patient and updated if appropriate.  Patient Active Problem List   Diagnosis Date Noted  . Obese 12/29/2017  . Cardiac arrhythmia 12/29/2017  . White coat syndrome with diagnosis of hypertension 11/17/2013    Current Outpatient Medications on File Prior to Visit  Medication Sig Dispense Refill  . hydrochlorothiazide (HYDRODIURIL) 25 MG tablet Take 25 mg by mouth daily.    . Multiple Vitamins-Minerals (MULTIVITAMIN PO) Take by mouth daily.     No current facility-administered medications on file prior to visit.     History reviewed. No pertinent past medical history.  Past Surgical History:  Procedure Laterality Date  . BREAST BIOPSY     left    Social History   Socioeconomic History  . Marital status: Married    Spouse name: None  . Number of children: 2  . Years of education: None  . Highest education level: None  Social Needs  . Financial resource strain: None  . Food insecurity - worry: None  . Food insecurity - inability: None  . Transportation needs - medical: None  . Transportation needs - non-medical: None  Occupational History  . None  Tobacco Use  . Smoking status: Never Smoker  . Smokeless tobacco: Never Used  Substance and Sexual Activity  . Alcohol use: Yes    Alcohol/week: 4.2 oz    Types: 7 Glasses of wine per week  . Drug use: No  . Sexual activity: Yes    Comment: husband had vasectomy  Other Topics Concern  . None  Social History Narrative   Non regular exercise   Not working   Married   2 kids, 2 grandkids    Family History  Problem Relation Age of Onset  . Colon cancer Paternal Grandmother   . Arthritis Mother   . Heart disease Mother   . Hypertension Father   . Prostate cancer  Father   . Breast cancer Maternal Aunt   . Prostate cancer Brother   . Esophageal cancer Neg Hx   . Stomach cancer Neg Hx   . Rectal cancer Neg Hx     Review of Systems  Constitutional: Negative for appetite change, chills, fatigue and fever.  Eyes: Negative for visual disturbance.  Respiratory: Negative for cough, shortness of breath and wheezing.   Cardiovascular: Negative for chest pain, palpitations and leg swelling.  Gastrointestinal: Negative for abdominal pain, blood in stool, constipation, diarrhea and nausea.       Rare gerd  Genitourinary: Negative for dysuria and hematuria.  Musculoskeletal: Negative for arthralgias, back pain and myalgias.  Skin: Negative for color change and rash.  Neurological: Negative for dizziness, light-headedness and headaches.  Psychiatric/Behavioral: Negative for dysphoric mood and sleep disturbance. The patient is not nervous/anxious.        Objective:   Vitals:   12/29/17 0941  BP: (!) 154/96  Pulse: 79  Resp: 16  Temp: 98.1 F (36.7 C)  SpO2: 98%   Filed Weights   12/29/17 0941  Weight: 195 lb (88.5 kg)   Body mass index is 33.47 kg/m.  Wt Readings from Last 3 Encounters:  12/29/17 195 lb (88.5 kg)  11/17/13 172 lb 12.8 oz (78.4  kg)  01/28/13 176 lb (79.8 kg)     Physical Exam Constitutional: She appears well-developed and well-nourished. No distress.  HENT:  Head: Normocephalic and atraumatic.  Right Ear: External ear normal. Normal ear canal and TM Left Ear: External ear normal.  Normal ear canal and TM Mouth/Throat: Oropharynx is clear and moist.  Eyes: Conjunctivae and EOM are normal.  Neck: Neck supple. No tracheal deviation present. No thyromegaly present.  No carotid bruit  Cardiovascular: Normal rate, regular rhythm, but with premature beats fairly often -likely PAC's and normal heart sounds. No murmur heard.  No edema. Pulmonary/Chest: Effort normal and breath sounds normal. No respiratory distress. She has no  wheezes. She has no rales.  Breast: deferred to Gyn Abdominal: Soft. She exhibits no distension. There is no tenderness.  Lymphadenopathy: She has no cervical adenopathy.  Skin: Skin is warm and dry. She is not diaphoretic.  Psychiatric: She has a normal mood and affect. Her behavior is normal.        Assessment & Plan:   Physical exam: Screening blood work   Ordered by [facility] Immunizations   Td up to date, flu deferred Colonoscopy   Up to date  Mammogram  Up to date  Gyn   Up to date  Eye exams   Up to date  EKG  Today for elevated BP and slightly abnormal rhythm - NSR at 80 bpm, frequent PAC's, normal EKG - unchanged from 2014 EKG Exercise  None - stressed regular exercise Weight  Advised weight loss Skin   No concerns Substance abuse   none  See Problem List for Assessment and Plan of chronic medical problems.

## 2017-12-29 ENCOUNTER — Ambulatory Visit: Payer: BLUE CROSS/BLUE SHIELD | Admitting: Internal Medicine

## 2017-12-29 ENCOUNTER — Encounter: Payer: Self-pay | Admitting: Internal Medicine

## 2017-12-29 VITALS — BP 154/96 | HR 79 | Temp 98.1°F | Resp 16 | Ht 64.0 in | Wt 195.0 lb

## 2017-12-29 DIAGNOSIS — Z0001 Encounter for general adult medical examination with abnormal findings: Secondary | ICD-10-CM

## 2017-12-29 DIAGNOSIS — R030 Elevated blood-pressure reading, without diagnosis of hypertension: Secondary | ICD-10-CM

## 2017-12-29 DIAGNOSIS — Z0000 Encounter for general adult medical examination without abnormal findings: Secondary | ICD-10-CM

## 2017-12-29 DIAGNOSIS — E6609 Other obesity due to excess calories: Secondary | ICD-10-CM

## 2017-12-29 DIAGNOSIS — Z6833 Body mass index (BMI) 33.0-33.9, adult: Secondary | ICD-10-CM

## 2017-12-29 DIAGNOSIS — I491 Atrial premature depolarization: Secondary | ICD-10-CM | POA: Insufficient documentation

## 2017-12-29 DIAGNOSIS — I10 Essential (primary) hypertension: Secondary | ICD-10-CM

## 2017-12-29 DIAGNOSIS — E669 Obesity, unspecified: Secondary | ICD-10-CM | POA: Insufficient documentation

## 2017-12-29 DIAGNOSIS — I499 Cardiac arrhythmia, unspecified: Secondary | ICD-10-CM

## 2017-12-29 NOTE — Assessment & Plan Note (Signed)
Taking hctz daily - prescribed by gyn ? BP controlled Likely has htn, but also with likely white coat htn Advised to start monitoring BP at home regularly - discussed goal BP Start regular exercise, work on weight loss Having blood work done by [facility] [employer]

## 2017-12-29 NOTE — Assessment & Plan Note (Signed)
Extra beats heard on exam - likely PACs -- confirmed by EKG Nothing concerning - she is asymptomatic

## 2017-12-29 NOTE — Assessment & Plan Note (Signed)
With htn Advised regular exercise Healthy diet, decreased portions

## 2018-01-02 DIAGNOSIS — Z131 Encounter for screening for diabetes mellitus: Secondary | ICD-10-CM | POA: Diagnosis not present

## 2018-01-02 DIAGNOSIS — Z1329 Encounter for screening for other suspected endocrine disorder: Secondary | ICD-10-CM | POA: Diagnosis not present

## 2018-01-02 DIAGNOSIS — Z1321 Encounter for screening for nutritional disorder: Secondary | ICD-10-CM | POA: Diagnosis not present

## 2018-01-02 DIAGNOSIS — Z13220 Encounter for screening for lipoid disorders: Secondary | ICD-10-CM | POA: Diagnosis not present

## 2018-01-02 DIAGNOSIS — Z13820 Encounter for screening for osteoporosis: Secondary | ICD-10-CM | POA: Diagnosis not present

## 2018-01-02 LAB — LIPID PANEL
Cholesterol: 345 — AB (ref 0–200)
HDL: 45 (ref 35–70)
Triglycerides: 451 — AB (ref 40–160)

## 2018-01-09 ENCOUNTER — Encounter: Payer: Self-pay | Admitting: Internal Medicine

## 2018-01-09 DIAGNOSIS — E785 Hyperlipidemia, unspecified: Secondary | ICD-10-CM | POA: Insufficient documentation

## 2018-01-09 DIAGNOSIS — R7303 Prediabetes: Secondary | ICD-10-CM | POA: Insufficient documentation

## 2018-01-15 ENCOUNTER — Encounter: Payer: Self-pay | Admitting: Internal Medicine

## 2018-02-02 ENCOUNTER — Encounter: Payer: Self-pay | Admitting: Gastroenterology

## 2018-12-23 DIAGNOSIS — Z01419 Encounter for gynecological examination (general) (routine) without abnormal findings: Secondary | ICD-10-CM | POA: Diagnosis not present

## 2018-12-23 DIAGNOSIS — Z1231 Encounter for screening mammogram for malignant neoplasm of breast: Secondary | ICD-10-CM | POA: Diagnosis not present

## 2018-12-23 DIAGNOSIS — Z6833 Body mass index (BMI) 33.0-33.9, adult: Secondary | ICD-10-CM | POA: Diagnosis not present

## 2018-12-30 ENCOUNTER — Encounter: Payer: BLUE CROSS/BLUE SHIELD | Admitting: Internal Medicine

## 2018-12-30 LAB — HM PAP SMEAR: HM Pap smear: NEGATIVE

## 2018-12-31 ENCOUNTER — Encounter: Payer: BLUE CROSS/BLUE SHIELD | Admitting: Internal Medicine

## 2019-01-09 NOTE — Patient Instructions (Addendum)
Monitor BP at home - ideal BP is 130/90, must be less than 140/90.  Tests ordered today. Your results will be released to [place] (or called to you) after review, usually within 72hours after test completion. If any changes need to be made, you will be notified at that same time.  All other Health Maintenance issues reviewed.   All recommended immunizations and age-appropriate screenings are up-to-date or discussed.  No immunizations administered today.   Medications reviewed and updated.  Changes include :   none   Please followup in one year   Health Maintenance, Female Adopting a healthy lifestyle and getting preventive care can go a long way to promote health and wellness. Talk with your health care provider about what schedule of regular examinations is right for you. This is a good chance for you to check in with your provider about disease prevention and staying healthy. In between checkups, there are plenty of things you can do on your own. Experts have done a lot of research about which lifestyle changes and preventive measures are most likely to keep you healthy. Ask your health care provider for more information. Weight and diet Eat a healthy diet  Be sure to include plenty of vegetables, fruits, low-fat dairy products, and lean protein.  Do not eat a lot of foods high in solid fats, added sugars, or salt.  Get regular exercise. This is one of the most important things you can do for your health. ? Most adults should exercise for at least 150 minutes each week. The exercise should increase your heart rate and make you sweat (moderate-intensity exercise). ? Most adults should also do strengthening exercises at least twice a week. This is in addition to the moderate-intensity exercise. Maintain a healthy weight  Body mass index (BMI) is a measurement that can be used to identify possible weight problems. It estimates body fat based on height and weight. Your health care provider  can help determine your BMI and help you achieve or maintain a healthy weight.  For females 98 years of age and older: ? A BMI below 18.5 is considered underweight. ? A BMI of 18.5 to 24.9 is normal. ? A BMI of 25 to 29.9 is considered overweight. ? A BMI of 30 and above is considered obese. Watch levels of cholesterol and blood lipids  You should start having your blood tested for lipids and cholesterol at 57 years of age, then have this test every 5 years.  You may need to have your cholesterol levels checked more often if: ? Your lipid or cholesterol levels are high. ? You are older than 57 years of age. ? You are at high risk for heart disease. Cancer screening Lung Cancer  Lung cancer screening is recommended for adults 9-69 years old who are at high risk for lung cancer because of a history of smoking.  A yearly low-dose CT scan of the lungs is recommended for people who: ? Currently smoke. ? Have quit within the past 15 years. ? Have at least a 30-pack-year history of smoking. A pack year is smoking an average of one pack of cigarettes a day for 1 year.  Yearly screening should continue until it has been 15 years since you quit.  Yearly screening should stop if you develop a health problem that would prevent you from having lung cancer treatment. Breast Cancer  Practice breast self-awareness. This means understanding how your breasts normally appear and feel.  It also means doing regular  breast self-exams. Let your health care provider know about any changes, no matter how small.  If you are in your 20s or 30s, you should have a clinical breast exam (CBE) by a health care provider every 1-3 years as part of a regular health exam.  If you are 8 or older, have a CBE every year. Also consider having a breast X-ray (mammogram) every year.  If you have a family history of breast cancer, talk to your health care provider about genetic screening.  If you are at high risk for  breast cancer, talk to your health care provider about having an MRI and a mammogram every year.  Breast cancer gene (BRCA) assessment is recommended for women who have family members with BRCA-related cancers. BRCA-related cancers include: ? Breast. ? Ovarian. ? Tubal. ? Peritoneal cancers.  Results of the assessment will determine the need for genetic counseling and BRCA1 and BRCA2 testing. Cervical Cancer Your health care provider may recommend that you be screened regularly for cancer of the pelvic organs (ovaries, uterus, and vagina). This screening involves a pelvic examination, including checking for microscopic changes to the surface of your cervix (Pap test). You may be encouraged to have this screening done every 3 years, beginning at age 60.  For women ages 15-65, health care providers may recommend pelvic exams and Pap testing every 3 years, or they may recommend the Pap and pelvic exam, combined with testing for human papilloma virus (HPV), every 5 years. Some types of HPV increase your risk of cervical cancer. Testing for HPV may also be done on women of any age with unclear Pap test results.  Other health care providers may not recommend any screening for nonpregnant women who are considered low risk for pelvic cancer and who do not have symptoms. Ask your health care provider if a screening pelvic exam is right for you.  If you have had past treatment for cervical cancer or a condition that could lead to cancer, you need Pap tests and screening for cancer for at least 20 years after your treatment. If Pap tests have been discontinued, your risk factors (such as having a new sexual partner) need to be reassessed to determine if screening should resume. Some women have medical problems that increase the chance of getting cervical cancer. In these cases, your health care provider may recommend more frequent screening and Pap tests. Colorectal Cancer  This type of cancer can be  detected and often prevented.  Routine colorectal cancer screening usually begins at 57 years of age and continues through 57 years of age.  Your health care provider may recommend screening at an earlier age if you have risk factors for colon cancer.  Your health care provider may also recommend using home test kits to check for hidden blood in the stool.  A small camera at the end of a tube can be used to examine your colon directly (sigmoidoscopy or colonoscopy). This is done to check for the earliest forms of colorectal cancer.  Routine screening usually begins at age 39.  Direct examination of the colon should be repeated every 5-10 years through 57 years of age. However, you may need to be screened more often if early forms of precancerous polyps or small growths are found. Skin Cancer  Check your skin from head to toe regularly.  Tell your health care provider about any new moles or changes in moles, especially if there is a change in a mole's shape or color.  Also tell your health care provider if you have a mole that is larger than the size of a pencil eraser.  Always use sunscreen. Apply sunscreen liberally and repeatedly throughout the day.  Protect yourself by wearing long sleeves, pants, a wide-brimmed hat, and sunglasses whenever you are outside. Heart disease, diabetes, and high blood pressure  High blood pressure causes heart disease and increases the risk of stroke. High blood pressure is more likely to develop in: ? People who have blood pressure in the high end of the normal range (130-139/85-89 mm Hg). ? People who are overweight or obese. ? People who are African American.  If you are 67-74 years of age, have your blood pressure checked every 3-5 years. If you are 37 years of age or older, have your blood pressure checked every year. You should have your blood pressure measured twice-once when you are at a hospital or clinic, and once when you are not at a hospital  or clinic. Record the average of the two measurements. To check your blood pressure when you are not at a hospital or clinic, you can use: ? An automated blood pressure machine at a pharmacy. ? A home blood pressure monitor.  If you are between 12 years and 75 years old, ask your health care provider if you should take aspirin to prevent strokes.  Have regular diabetes screenings. This involves taking a blood sample to check your fasting blood sugar level. ? If you are at a normal weight and have a low risk for diabetes, have this test once every three years after 57 years of age. ? If you are overweight and have a high risk for diabetes, consider being tested at a younger age or more often. Preventing infection Hepatitis B  If you have a higher risk for hepatitis B, you should be screened for this virus. You are considered at high risk for hepatitis B if: ? You were born in a country where hepatitis B is common. Ask your health care provider which countries are considered high risk. ? Your parents were born in a high-risk country, and you have not been immunized against hepatitis B (hepatitis B vaccine). ? You have HIV or AIDS. ? You use needles to inject street drugs. ? You live with someone who has hepatitis B. ? You have had sex with someone who has hepatitis B. ? You get hemodialysis treatment. ? You take certain medicines for conditions, including cancer, organ transplantation, and autoimmune conditions. Hepatitis C  Blood testing is recommended for: ? Everyone born from [birth year] through 1964. ? Anyone with known risk factors for hepatitis C. Sexually transmitted infections (STIs)  You should be screened for sexually transmitted infections (STIs) including gonorrhea and chlamydia if: ? You are sexually active and are younger than 57 years of age. ? You are older than 57 years of age and your health care provider tells you that you are at risk for this type of infection. ? Your sexual  activity has changed since you were last screened and you are at an increased risk for chlamydia or gonorrhea. Ask your health care provider if you are at risk.  If you do not have HIV, but are at risk, it may be recommended that you take a prescription medicine daily to prevent HIV infection. This is called pre-exposure prophylaxis (PrEP). You are considered at risk if: ? You are sexually active and do not regularly use condoms or know the HIV status of your partner(s). ?  You take drugs by injection. ? You are sexually active with a partner who has HIV. Talk with your health care provider about whether you are at high risk of being infected with HIV. If you choose to begin PrEP, you should first be tested for HIV. You should then be tested every 3 months for as long as you are taking PrEP. Pregnancy  If you are premenopausal and you may become pregnant, ask your health care provider about preconception counseling.  If you may become pregnant, take 400 to 800 micrograms (mcg) of folic acid every day.  If you want to prevent pregnancy, talk to your health care provider about birth control (contraception). Osteoporosis and menopause  Osteoporosis is a disease in which the bones lose minerals and strength with aging. This can result in serious bone fractures. Your risk for osteoporosis can be identified using a bone density scan.  If you are 64 years of age or older, or if you are at risk for osteoporosis and fractures, ask your health care provider if you should be screened.  Ask your health care provider whether you should take a calcium or vitamin D supplement to lower your risk for osteoporosis.  Menopause may have certain physical symptoms and risks.  Hormone replacement therapy may reduce some of these symptoms and risks. Talk to your health care provider about whether hormone replacement therapy is right for you. Follow these instructions at home:  Schedule regular health, dental, and  eye exams.  Stay current with your immunizations.  Do not use any tobacco products including cigarettes, chewing tobacco, or electronic cigarettes.  If you are pregnant, do not drink alcohol.  If you are breastfeeding, limit how much and how often you drink alcohol.  Limit alcohol intake to no more than 1 drink per day for nonpregnant women. One drink equals 12 ounces of beer, 5 ounces of wine, or 1 ounces of hard liquor.  Do not use street drugs.  Do not share needles.  Ask your health care provider for help if you need support or information about quitting drugs.  Tell your health care provider if you often feel depressed.  Tell your health care provider if you have ever been abused or do not feel safe at home. This information is not intended to replace advice given to you by your health care provider. Make sure you discuss any questions you have with your health care provider. Document Released: 06/10/2011 Document Revised: 05/02/2016 Document Reviewed: 08/29/2015 Elsevier Interactive Patient Education  2019 [employer].

## 2019-01-09 NOTE — Progress Notes (Signed)
Subjective:    Patient ID: Jody Thompson, female    DOB: Aug 28, 1961, 58 y.o.   MRN: [id number]  HPI She is here for a physical exam.   She denies changes in her history and has no concerns.   Medications and allergies reviewed with patient and updated if appropriate.  Patient Active Problem List   Diagnosis Date Noted  . Hyperlipidemia 01/09/2018  . Prediabetes 01/09/2018  . Obese 12/29/2017  . Cardiac arrhythmia 12/29/2017  . White coat syndrome with diagnosis of hypertension 11/17/2013    Current Outpatient Medications on File Prior to Visit  Medication Sig Dispense Refill  . hydrochlorothiazide (HYDRODIURIL) 25 MG tablet Take 25 mg by mouth daily.    . Multiple Vitamins-Minerals (MULTIVITAMIN PO) Take by mouth daily.     No current facility-administered medications on file prior to visit.     No past medical history on file.  Past Surgical History:  Procedure Laterality Date  . BREAST BIOPSY     left    Social History   Socioeconomic History  . Marital status: Married    Spouse name: Not on file  . Number of children: 2  . Years of education: Not on file  . Highest education level: Not on file  Occupational History  . Not on file  Social Needs  . Financial resource strain: Not on file  . Food insecurity:    Worry: Not on file    Inability: Not on file  . Transportation needs:    Medical: Not on file    Non-medical: Not on file  Tobacco Use  . Smoking status: Never Smoker  . Smokeless tobacco: Never Used  Substance and Sexual Activity  . Alcohol use: Yes    Alcohol/week: 7.0 standard drinks    Types: 7 Glasses of wine per week  . Drug use: No  . Sexual activity: Yes    Comment: husband had vasectomy  Lifestyle  . Physical activity:    Days per week: Not on file    Minutes per session: Not on file  . Stress: Not on file  Relationships  . Social connections:    Talks on phone: Not on file    Gets together: Not on file    Attends  religious service: Not on file    Active member of club or organization: Not on file    Attends meetings of clubs or organizations: Not on file    Relationship status: Not on file  Other Topics Concern  . Not on file  Social History Narrative   Non regular exercise   Not working   Married   2 kids, 2 grandkids    Family History  Problem Relation Age of Onset  . Colon cancer Paternal Grandmother   . Arthritis Mother   . Heart disease Mother   . Hypertension Father   . Prostate cancer Father   . Breast cancer Maternal Aunt   . Prostate cancer Brother   . Esophageal cancer Neg Hx   . Stomach cancer Neg Hx   . Rectal cancer Neg Hx     Review of Systems  Constitutional: Negative for chills and fever.  Eyes: Negative for visual disturbance.  Respiratory: Negative for cough, shortness of breath and wheezing.   Cardiovascular: Negative for chest pain, palpitations and leg swelling.  Gastrointestinal: Negative for abdominal pain, blood in stool, constipation, diarrhea and nausea.       GERD  Genitourinary: Negative for dysuria and hematuria.  Musculoskeletal:  Negative for arthralgias and back pain.  Skin: Positive for rash (left ring finger - from wedding ring). Negative for color change.  Neurological: Negative for light-headedness and headaches.  Psychiatric/Behavioral: Negative for dysphoric mood. The patient is not nervous/anxious.        Objective:   Vitals:   01/11/19 0759  BP: (!) 160/94  Pulse: 74  Resp: 16  Temp: 98.3 F (36.8 C)  SpO2: 99%   Filed Weights   01/11/19 0759  Weight: 188 lb 6.4 oz (85.5 kg)   Body mass index is 32.34 kg/m.  BP Readings from Last 3 Encounters:  01/11/19 (!) 160/94  12/29/17 (!) 154/96  11/17/13 126/84    Wt Readings from Last 3 Encounters:  01/11/19 188 lb 6.4 oz (85.5 kg)  12/29/17 195 lb (88.5 kg)  11/17/13 172 lb 12.8 oz (78.4 kg)     Physical Exam Constitutional: She appears well-developed and well-nourished.  No distress.  HENT:  Head: Normocephalic and atraumatic.  Right Ear: External ear normal. Normal ear canal and TM Left Ear: External ear normal.  Normal ear canal and TM Mouth/Throat: Oropharynx is clear and moist.  Eyes: Conjunctivae and EOM are normal.  Neck: Neck supple. No tracheal deviation present. No thyromegaly present.  No carotid bruit  Cardiovascular: Normal rate, regular rhythm and normal heart sounds.   No murmur heard.  No edema. Pulmonary/Chest: Effort normal and breath sounds normal. No respiratory distress. She has no wheezes. She has no rales.  Breast: deferred to Gyn Abdominal: Soft. She exhibits no distension. There is no tenderness.  Lymphadenopathy: She has no cervical adenopathy.  Skin: Skin is warm and dry. She is not diaphoretic.   Mild red rash in shape of ring on left ring finger Psychiatric: She has a normal mood and affect. Her behavior is normal.        Assessment & Plan:   Physical exam: Screening blood work  ordered Immunizations  Deferred, Up to date  Colonoscopy  Last done 2014 -- 5 years Mammogram  Up to date  Gyn  Up to date  Eye exams   Up to date  EKG   Done 12/2017 Exercise regular Weight  Working on weight loss Skin   No concerns Substance abuse   none  See Problem List for Assessment and Plan of chronic medical problems.   FU in one year

## 2019-01-11 ENCOUNTER — Encounter: Payer: Self-pay | Admitting: Internal Medicine

## 2019-01-11 ENCOUNTER — Ambulatory Visit (INDEPENDENT_AMBULATORY_CARE_PROVIDER_SITE_OTHER): Payer: 59 | Admitting: Internal Medicine

## 2019-01-11 ENCOUNTER — Other Ambulatory Visit (INDEPENDENT_AMBULATORY_CARE_PROVIDER_SITE_OTHER): Payer: 59

## 2019-01-11 VITALS — BP 160/94 | HR 74 | Temp 98.3°F | Resp 16 | Ht 64.0 in | Wt 188.4 lb

## 2019-01-11 DIAGNOSIS — I10 Essential (primary) hypertension: Secondary | ICD-10-CM | POA: Diagnosis not present

## 2019-01-11 DIAGNOSIS — Z0000 Encounter for general adult medical examination without abnormal findings: Secondary | ICD-10-CM

## 2019-01-11 DIAGNOSIS — E782 Mixed hyperlipidemia: Secondary | ICD-10-CM

## 2019-01-11 DIAGNOSIS — R7303 Prediabetes: Secondary | ICD-10-CM

## 2019-01-11 DIAGNOSIS — Z6832 Body mass index (BMI) 32.0-32.9, adult: Secondary | ICD-10-CM

## 2019-01-11 DIAGNOSIS — E6609 Other obesity due to excess calories: Secondary | ICD-10-CM

## 2019-01-11 DIAGNOSIS — R21 Rash and other nonspecific skin eruption: Secondary | ICD-10-CM | POA: Insufficient documentation

## 2019-01-11 LAB — CBC WITH DIFFERENTIAL/PLATELET
Basophils Absolute: 0.1 10*3/uL (ref 0.0–0.1)
Basophils Relative: 1.2 % (ref 0.0–3.0)
Eosinophils Absolute: 0.1 10*3/uL (ref 0.0–0.7)
Eosinophils Relative: 2.9 % (ref 0.0–5.0)
HCT: 43 % (ref 36.0–46.0)
Hemoglobin: 14.4 g/dL (ref 12.0–15.0)
Lymphocytes Relative: 35.9 % (ref 12.0–46.0)
Lymphs Abs: 1.7 10*3/uL (ref 0.7–4.0)
MCHC: 33.5 g/dL (ref 30.0–36.0)
MCV: 92.6 fl (ref 78.0–100.0)
MONO ABS: 0.5 10*3/uL (ref 0.1–1.0)
Monocytes Relative: 9.5 % (ref 3.0–12.0)
NEUTROS PCT: 50.5 % (ref 43.0–77.0)
Neutro Abs: 2.4 10*3/uL (ref 1.4–7.7)
Platelets: 252 10*3/uL (ref 150.0–400.0)
RBC: 4.64 Mil/uL (ref 3.87–5.11)
RDW: 14.1 % (ref 11.5–15.5)
WBC: 4.8 10*3/uL (ref 4.0–10.5)

## 2019-01-11 LAB — COMPREHENSIVE METABOLIC PANEL
ALBUMIN: 4.7 g/dL (ref 3.5–5.2)
ALT: 27 U/L (ref 0–35)
AST: 21 U/L (ref 0–37)
Alkaline Phosphatase: 60 U/L (ref 39–117)
BUN: 11 mg/dL (ref 6–23)
CALCIUM: 10 mg/dL (ref 8.4–10.5)
CHLORIDE: 100 meq/L (ref 96–112)
CO2: 29 meq/L (ref 19–32)
Creatinine, Ser: 0.79 mg/dL (ref 0.40–1.20)
GFR: 75.14 mL/min (ref >60.00)
Glucose, Bld: 110 mg/dL — ABNORMAL HIGH (ref 70–99)
Potassium: 4 mEq/L (ref 3.5–5.1)
SODIUM: 140 meq/L (ref 135–145)
Total Bilirubin: 0.6 mg/dL (ref 0.2–1.2)
Total Protein: 7.1 g/dL (ref 6.0–8.3)

## 2019-01-11 LAB — LIPID PANEL
CHOL/HDL RATIO: 7
Cholesterol: 331 mg/dL — ABNORMAL HIGH (ref 0–200)
HDL: 47 mg/dL (ref >39.00)
NonHDL: 283.67
Triglycerides: 328 mg/dL — ABNORMAL HIGH (ref 0.0–149.0)
VLDL: 65.6 mg/dL — ABNORMAL HIGH (ref 0.0–40.0)

## 2019-01-11 LAB — TSH: TSH: 2.47 u[IU]/mL (ref 0.35–4.50)

## 2019-01-11 LAB — HEMOGLOBIN A1C: HEMOGLOBIN A1C: 5.7 % (ref 4.6–6.5)

## 2019-01-11 LAB — LDL CHOLESTEROL, DIRECT: Direct LDL: 229 mg/dL

## 2019-01-11 MED ORDER — CLOTRIMAZOLE-BETAMETHASONE 1-0.05 % EX CREA: 1.0000 "application " | TOPICAL_CREAM | Freq: Two times a day (BID) | CUTANEOUS | 0 refills | Status: DC

## 2019-01-11 NOTE — Assessment & Plan Note (Signed)
Check a1c Low sugar / carb diet Stressed regular exercise   

## 2019-01-11 NOTE — Assessment & Plan Note (Signed)
Left ring finger from wedding ring - no symptoms and has not worn her ring in a while ? Fungal or contact dermatitis lotrisone BID prn

## 2019-01-11 NOTE — Assessment & Plan Note (Signed)
Check lipid panel, cmp, tsh Regular exercise and healthy diet encouraged   

## 2019-01-11 NOTE — Assessment & Plan Note (Signed)
?   Controlled or white coat htn Advised to monitor BP at home Not taking hctz regularly - advised to start taking it daily Working on weight loss  Ck  Labs - cmp, tsh She will update me with her home readings

## 2019-01-11 NOTE — Assessment & Plan Note (Signed)
Working on weight loss

## 2019-01-13 ENCOUNTER — Encounter: Payer: Self-pay | Admitting: Internal Medicine

## 2019-01-14 ENCOUNTER — Encounter: Payer: Self-pay | Admitting: Internal Medicine

## 2019-02-18 ENCOUNTER — Encounter: Payer: Self-pay | Admitting: Internal Medicine

## 2019-03-10 ENCOUNTER — Encounter: Payer: Self-pay | Admitting: Internal Medicine

## 2019-03-10 ENCOUNTER — Other Ambulatory Visit: Payer: Self-pay

## 2019-03-10 MED ORDER — HYDROCHLOROTHIAZIDE 25 MG PO TABS: 25.0000 mg | ORAL_TABLET | Freq: Every day | ORAL | 1 refills | Status: DC

## 2019-09-03 ENCOUNTER — Other Ambulatory Visit: Payer: Self-pay | Admitting: Internal Medicine

## 2020-01-04 ENCOUNTER — Encounter: Payer: Self-pay | Admitting: Gastroenterology

## 2020-01-12 ENCOUNTER — Encounter: Payer: Self-pay | Admitting: Internal Medicine

## 2020-01-12 NOTE — Progress Notes (Signed)
Subjective:    Patient ID: Jody Thompson, female    DOB: 07-06-1961, 59 y.o.   MRN: [id number]  HPI She is here for a physical exam.   She denies major changes in her health and has no concerns.   She does monitor her BP at home.   She is exercising regularly and has lost weight.      Medications and allergies reviewed with patient and updated if appropriate.  Patient Active Problem List   Diagnosis Date Noted  . Rash and nonspecific skin eruption 01/11/2019  . Hyperlipidemia 01/09/2018  . Prediabetes 01/09/2018  . Obese 12/29/2017  . Cardiac arrhythmia 12/29/2017  . White coat syndrome with diagnosis of hypertension 11/17/2013    Current Outpatient Medications on File Prior to Visit  Medication Sig Dispense Refill  . cholecalciferol (VITAMIN D3) 25 MCG (1000 UNIT) tablet Take 1,000 Units by mouth daily.    . hydrochlorothiazide (HYDRODIURIL) 25 MG tablet TAKE 1 TABLET(25 MG) BY MOUTH DAILY 90 tablet 1  . Multiple Vitamins-Minerals (MULTIVITAMIN PO) Take by mouth daily.    . Red Yeast Rice Extract (RED YEAST RICE PO) Take by mouth.    . vitamin C (ASCORBIC ACID) 250 MG tablet Take 250 mg by mouth daily.     No current facility-administered medications on file prior to visit.    History reviewed. No pertinent past medical history.  Past Surgical History:  Procedure Laterality Date  . BREAST BIOPSY     left    Social History   Socioeconomic History  . Marital status: Married    Spouse name: Not on file  . Number of children: 2  . Years of education: Not on file  . Highest education level: Not on file  Occupational History  . Not on file  Tobacco Use  . Smoking status: Never Smoker  . Smokeless tobacco: Never Used  Substance and Sexual Activity  . Alcohol use: Yes    Alcohol/week: 7.0 standard drinks    Types: 7 Glasses of wine per week  . Drug use: No  . Sexual activity: Yes    Comment: husband had vasectomy  Other Topics Concern  . Not on file   Social History Narrative   Non regular exercise   Not working   Married   2 kids, 2 grandkids   Social Determinants of Radio broadcast assistant Strain:   . Difficulty of Paying Living Expenses: Not on file  Food Insecurity:   . Worried About Charity fundraiser in the Last Year: Not on file  . Ran Out of Food in the Last Year: Not on file  Transportation Needs:   . Lack of Transportation (Medical): Not on file  . Lack of Transportation (Non-Medical): Not on file  Physical Activity:   . Days of Exercise per Week: Not on file  . Minutes of Exercise per Session: Not on file  Stress:   . Feeling of Stress : Not on file  Social Connections:   . Frequency of Communication with Friends and Family: Not on file  . Frequency of Social Gatherings with Friends and Family: Not on file  . Attends Religious Services: Not on file  . Active Member of Clubs or Organizations: Not on file  . Attends Archivist Meetings: Not on file  . Marital Status: Not on file    Family History  Problem Relation Age of Onset  . Colon cancer Paternal Grandmother   . Arthritis Mother   .  Heart disease Mother   . Hypertension Father   . Prostate cancer Father   . Breast cancer Maternal Aunt   . Prostate cancer Brother   . Esophageal cancer Neg Hx   . Stomach cancer Neg Hx   . Rectal cancer Neg Hx     Review of Systems  Constitutional: Negative for chills and fever.  Eyes: Negative for visual disturbance.  Respiratory: Negative for cough, shortness of breath and wheezing.   Cardiovascular: Negative for chest pain, palpitations and leg swelling.  Gastrointestinal: Negative for abdominal pain, blood in stool, constipation, diarrhea and nausea.       GERD tums on occasion  Genitourinary: Negative for dysuria and hematuria.  Musculoskeletal: Negative for arthralgias and back pain.  Skin: Negative for color change and rash.  Neurological: Negative for dizziness, light-headedness and  headaches.  Psychiatric/Behavioral: Negative for dysphoric mood. The patient is not nervous/anxious.        Objective:   Vitals:   01/13/20 0749  BP: (!) 162/94  Pulse: 81  Resp: 16  Temp: 98.3 F (36.8 C)  SpO2: 99%   Filed Weights   01/13/20 0749  Weight: 173 lb 12.8 oz (78.8 kg)   Body mass index is 29.83 kg/m.  BP Readings from Last 3 Encounters:  01/13/20 (!) 162/94  01/11/19 (!) 160/94  12/29/17 (!) 154/96    Wt Readings from Last 3 Encounters:  01/13/20 173 lb 12.8 oz (78.8 kg)  01/11/19 188 lb 6.4 oz (85.5 kg)  12/29/17 195 lb (88.5 kg)     Physical Exam Constitutional: She appears well-developed and well-nourished. No distress.  HENT:  Head: Normocephalic and atraumatic.  Right Ear: External ear normal. Normal ear canal and TM Left Ear: External ear normal.  Normal ear canal and TM Mouth/Throat: Oropharynx is clear and moist.  Eyes: Conjunctivae and EOM are normal.  Neck: Neck supple. No tracheal deviation present. No thyromegaly present.  No carotid bruit  Cardiovascular: Normal rate, regular rhythm and normal heart sounds.   No murmur heard.  No edema. Pulmonary/Chest: Effort normal and breath sounds normal. No respiratory distress. She has no wheezes. She has no rales.  Breast: deferred   Abdominal: Soft. She exhibits no distension. There is no tenderness.  Lymphadenopathy: She has no cervical adenopathy.  Skin: Skin is warm and dry. She is not diaphoretic.  Psychiatric: She has a normal mood and affect. Her behavior is normal.        Assessment & Plan:   Physical exam: Screening blood work    ordered Immunizations  Flu vaccine today , had shingrix Colonoscopy    scheduled Mammogram   Up to date  Gyn   Up to date  Eye exams  Up to date  Exercise  Regular - walking Weight   Has lost weight  - will continue efforts Substance abuse   none  See Problem List for Assessment and Plan of chronic medical problems.    This visit occurred  during the SARS-CoV-2 public health emergency.  Safety protocols were in place, including screening questions prior to the visit, additional usage of staff PPE, and extensive cleaning of exam room while observing appropriate contact time as indicated for disinfecting solutions.

## 2020-01-12 NOTE — Patient Instructions (Addendum)
Blood work was ordered.    All other Health Maintenance issues reviewed.   All recommended immunizations and age-appropriate screenings are up-to-date or discussed.  Flu immunization administered today.   Medications reviewed and updated.  Changes include :   none  Your prescription(s) have been submitted to your pharmacy. Please take as directed and contact our office if you believe you are having problem(s) with the medication(s).    Please followup in 1 year   Health Maintenance, Female Adopting a healthy lifestyle and getting preventive care are important in promoting health and wellness. Ask your health care provider about:  The right schedule for you to have regular tests and exams.  Things you can do on your own to prevent diseases and keep yourself healthy. What should I know about diet, weight, and exercise? Eat a healthy diet   Eat a diet that includes plenty of vegetables, fruits, low-fat dairy products, and lean protein.  Do not eat a lot of foods that are high in solid fats, added sugars, or sodium. Maintain a healthy weight Body mass index (BMI) is used to identify weight problems. It estimates body fat based on height and weight. Your health care provider can help determine your BMI and help you achieve or maintain a healthy weight. Get regular exercise Get regular exercise. This is one of the most important things you can do for your health. Most adults should:  Exercise for at least 150 minutes each week. The exercise should increase your heart rate and make you sweat (moderate-intensity exercise).  Do strengthening exercises at least twice a week. This is in addition to the moderate-intensity exercise.  Spend less time sitting. Even light physical activity can be beneficial. Watch cholesterol and blood lipids Have your blood tested for lipids and cholesterol at 58 years of age, then have this test every 5 years. Have your cholesterol levels checked more  often if:  Your lipid or cholesterol levels are high.  You are older than 58 years of age.  You are at high risk for heart disease. What should I know about cancer screening? Depending on your health history and family history, you may need to have cancer screening at various ages. This may include screening for:  Breast cancer.  Cervical cancer.  Colorectal cancer.  Skin cancer.  Lung cancer. What should I know about heart disease, diabetes, and high blood pressure? Blood pressure and heart disease  High blood pressure causes heart disease and increases the risk of stroke. This is more likely to develop in people who have high blood pressure readings, are of African descent, or are overweight.  Have your blood pressure checked: ? Every 3-5 years if you are 18-39 years of age. ? Every year if you are 40 years old or older. Diabetes Have regular diabetes screenings. This checks your fasting blood sugar level. Have the screening done:  Once every three years after age 40 if you are at a normal weight and have a low risk for diabetes.  More often and at a younger age if you are overweight or have a high risk for diabetes. What should I know about preventing infection? Hepatitis B If you have a higher risk for hepatitis B, you should be screened for this virus. Talk with your health care provider to find out if you are at risk for hepatitis B infection. Hepatitis C Testing is recommended for:  Everyone born from 1945 through 1965.  Anyone with known risk factors for hepatitis C.   Sexually transmitted infections (STIs)  Get screened for STIs, including gonorrhea and chlamydia, if: ? You are sexually active and are younger than 58 years of age. ? You are older than 58 years of age and your health care provider tells you that you are at risk for this type of infection. ? Your sexual activity has changed since you were last screened, and you are at increased risk for chlamydia  or gonorrhea. Ask your health care provider if you are at risk.  Ask your health care provider about whether you are at high risk for HIV. Your health care provider may recommend a prescription medicine to help prevent HIV infection. If you choose to take medicine to prevent HIV, you should first get tested for HIV. You should then be tested every 3 months for as long as you are taking the medicine. Pregnancy  If you are about to stop having your period (premenopausal) and you may become pregnant, seek counseling before you get pregnant.  Take 400 to 800 micrograms (mcg) of folic acid every day if you become pregnant.  Ask for birth control (contraception) if you want to prevent pregnancy. Osteoporosis and menopause Osteoporosis is a disease in which the bones lose minerals and strength with aging. This can result in bone fractures. If you are 65 years old or older, or if you are at risk for osteoporosis and fractures, ask your health care provider if you should:  Be screened for bone loss.  Take a calcium or vitamin D supplement to lower your risk of fractures.  Be given hormone replacement therapy (HRT) to treat symptoms of menopause. Follow these instructions at home: Lifestyle  Do not use any products that contain nicotine or tobacco, such as cigarettes, e-cigarettes, and chewing tobacco. If you need help quitting, ask your health care provider.  Do not use street drugs.  Do not share needles.  Ask your health care provider for help if you need support or information about quitting drugs. Alcohol use  Do not drink alcohol if: ? Your health care provider tells you not to drink. ? You are pregnant, may be pregnant, or are planning to become pregnant.  If you drink alcohol: ? Limit how much you use to 0-1 drink a day. ? Limit intake if you are breastfeeding.  Be aware of how much alcohol is in your drink. In the [place] one drink equals one 12 oz bottle of beer (355 mL), one 5 oz  glass of wine (148 mL), or one 1 oz glass of hard liquor (44 mL). General instructions  Schedule regular health, dental, and eye exams.  Stay current with your vaccines.  Tell your health care provider if: ? You often feel depressed. ? You have ever been abused or do not feel safe at home. Summary  Adopting a healthy lifestyle and getting preventive care are important in promoting health and wellness.  Follow your health care provider's instructions about healthy diet, exercising, and getting tested or screened for diseases.  Follow your health care provider's instructions on monitoring your cholesterol and blood pressure. This information is not intended to replace advice given to you by your health care provider. Make sure you discuss any questions you have with your health care provider. Document Revised: 11/18/2018 Document Reviewed: 11/18/2018 Elsevier Patient Education  2020 Elsevier Inc.  

## 2020-01-13 ENCOUNTER — Other Ambulatory Visit: Payer: Self-pay

## 2020-01-13 ENCOUNTER — Encounter: Payer: Self-pay | Admitting: Internal Medicine

## 2020-01-13 ENCOUNTER — Ambulatory Visit (INDEPENDENT_AMBULATORY_CARE_PROVIDER_SITE_OTHER): Payer: BC Managed Care – PPO | Admitting: Internal Medicine

## 2020-01-13 VITALS — BP 162/94 | HR 81 | Temp 98.3°F | Resp 16 | Ht 64.0 in | Wt 173.8 lb

## 2020-01-13 DIAGNOSIS — E782 Mixed hyperlipidemia: Secondary | ICD-10-CM

## 2020-01-13 DIAGNOSIS — Z23 Encounter for immunization: Secondary | ICD-10-CM | POA: Diagnosis not present

## 2020-01-13 DIAGNOSIS — Z0000 Encounter for general adult medical examination without abnormal findings: Secondary | ICD-10-CM

## 2020-01-13 DIAGNOSIS — I10 Essential (primary) hypertension: Secondary | ICD-10-CM | POA: Diagnosis not present

## 2020-01-13 DIAGNOSIS — R7303 Prediabetes: Secondary | ICD-10-CM

## 2020-01-13 NOTE — Assessment & Plan Note (Addendum)
Chronic Blood pressure typically elevated here and well controlled at home She has been monitoring at home and it has been controlled Continue current medication-hydrochlorothiazide 25 mg daily CMP, TSH

## 2020-01-13 NOTE — Assessment & Plan Note (Signed)
Chronic Check lipid panel, tsh, cmp  Has lost weight - will continue weight loss efforts Regular exercise and healthy diet encouraged

## 2020-01-13 NOTE — Assessment & Plan Note (Signed)
Chronic Check a1c Low sugar / carb diet Continue regular exercise 

## 2020-01-19 ENCOUNTER — Other Ambulatory Visit: Payer: Self-pay

## 2020-01-19 ENCOUNTER — Encounter: Payer: Self-pay | Admitting: Internal Medicine

## 2020-01-19 ENCOUNTER — Other Ambulatory Visit (INDEPENDENT_AMBULATORY_CARE_PROVIDER_SITE_OTHER): Payer: BC Managed Care – PPO

## 2020-01-19 DIAGNOSIS — I10 Essential (primary) hypertension: Secondary | ICD-10-CM | POA: Diagnosis not present

## 2020-01-19 DIAGNOSIS — Z0000 Encounter for general adult medical examination without abnormal findings: Secondary | ICD-10-CM | POA: Diagnosis not present

## 2020-01-19 DIAGNOSIS — R7303 Prediabetes: Secondary | ICD-10-CM

## 2020-01-19 LAB — LIPID PANEL
Cholesterol: 294 mg/dL — ABNORMAL HIGH (ref 0–200)
HDL: 48.3 mg/dL (ref >39.00)
NonHDL: 245.94
Total CHOL/HDL Ratio: 6
Triglycerides: 228 mg/dL — ABNORMAL HIGH (ref 0.0–149.0)
VLDL: 45.6 mg/dL — ABNORMAL HIGH (ref 0.0–40.0)

## 2020-01-19 LAB — LDL CHOLESTEROL, DIRECT: Direct LDL: 203 mg/dL

## 2020-01-19 LAB — CBC WITH DIFFERENTIAL/PLATELET
Basophils Absolute: 0.1 10*3/uL (ref 0.0–0.1)
Basophils Relative: 1.2 % (ref 0.0–3.0)
Eosinophils Absolute: 0.2 10*3/uL (ref 0.0–0.7)
Eosinophils Relative: 3.2 % (ref 0.0–5.0)
HCT: 41 % (ref 36.0–46.0)
Hemoglobin: 13.5 g/dL (ref 12.0–15.0)
Lymphocytes Relative: 29.3 % (ref 12.0–46.0)
Lymphs Abs: 1.8 10*3/uL (ref 0.7–4.0)
MCHC: 33 g/dL (ref 30.0–36.0)
MCV: 94.5 fl (ref 78.0–100.0)
Monocytes Absolute: 0.5 10*3/uL (ref 0.1–1.0)
Monocytes Relative: 7.5 % (ref 3.0–12.0)
Neutro Abs: 3.6 10*3/uL (ref 1.4–7.7)
Neutrophils Relative %: 58.8 % (ref 43.0–77.0)
Platelets: 256 10*3/uL (ref 150.0–400.0)
RBC: 4.34 Mil/uL (ref 3.87–5.11)
RDW: 13.3 % (ref 11.5–15.5)
WBC: 6.1 10*3/uL (ref 4.0–10.5)

## 2020-01-19 LAB — COMPREHENSIVE METABOLIC PANEL
ALT: 22 U/L (ref 0–35)
AST: 19 U/L (ref 0–37)
Albumin: 4.3 g/dL (ref 3.5–5.2)
Alkaline Phosphatase: 68 U/L (ref 39–117)
BUN: 9 mg/dL (ref 6–23)
CO2: 32 mEq/L (ref 19–32)
Calcium: 9.5 mg/dL (ref 8.4–10.5)
Chloride: 100 mEq/L (ref 96–112)
Creatinine, Ser: 0.69 mg/dL (ref 0.40–1.20)
GFR: 87.53 mL/min (ref >60.00)
Glucose, Bld: 94 mg/dL (ref 70–99)
Potassium: 3.9 mEq/L (ref 3.5–5.1)
Sodium: 138 mEq/L (ref 135–145)
Total Bilirubin: 0.4 mg/dL (ref 0.2–1.2)
Total Protein: 6.9 g/dL (ref 6.0–8.3)

## 2020-01-19 LAB — HEMOGLOBIN A1C: Hgb A1c MFr Bld: 5.8 % (ref 4.6–6.5)

## 2020-01-19 LAB — TSH: TSH: 1.59 u[IU]/mL (ref 0.35–4.50)

## 2020-01-26 DIAGNOSIS — Z6830 Body mass index (BMI) 30.0-30.9, adult: Secondary | ICD-10-CM | POA: Diagnosis not present

## 2020-01-26 DIAGNOSIS — Z13820 Encounter for screening for osteoporosis: Secondary | ICD-10-CM | POA: Diagnosis not present

## 2020-01-26 DIAGNOSIS — Z1231 Encounter for screening mammogram for malignant neoplasm of breast: Secondary | ICD-10-CM | POA: Diagnosis not present

## 2020-01-26 DIAGNOSIS — Z01419 Encounter for gynecological examination (general) (routine) without abnormal findings: Secondary | ICD-10-CM | POA: Diagnosis not present

## 2020-02-01 ENCOUNTER — Ambulatory Visit (AMBULATORY_SURGERY_CENTER): Payer: Self-pay

## 2020-02-01 ENCOUNTER — Other Ambulatory Visit: Payer: Self-pay

## 2020-02-01 VITALS — Temp 95.3°F | Ht 64.0 in | Wt 181.0 lb

## 2020-02-01 DIAGNOSIS — Z86010 Personal history of colonic polyps: Secondary | ICD-10-CM

## 2020-02-01 DIAGNOSIS — Z01818 Encounter for other preprocedural examination: Secondary | ICD-10-CM

## 2020-02-01 MED ORDER — NA SULFATE-K SULFATE-MG SULF 17.5-3.13-1.6 GM/177ML PO SOLN: 1.0000 | Freq: Once | ORAL | 0 refills | Status: AC

## 2020-02-01 NOTE — Progress Notes (Signed)

## 2020-02-08 ENCOUNTER — Other Ambulatory Visit: Payer: Self-pay | Admitting: Gastroenterology

## 2020-02-08 ENCOUNTER — Other Ambulatory Visit: Payer: Self-pay

## 2020-02-08 ENCOUNTER — Ambulatory Visit (INDEPENDENT_AMBULATORY_CARE_PROVIDER_SITE_OTHER): Payer: BC Managed Care – PPO

## 2020-02-08 DIAGNOSIS — Z1159 Encounter for screening for other viral diseases: Secondary | ICD-10-CM

## 2020-02-09 LAB — SARS CORONAVIRUS 2 (TAT 6-24 HRS): SARS Coronavirus 2: NEGATIVE

## 2020-02-10 ENCOUNTER — Encounter: Payer: Self-pay | Admitting: Gastroenterology

## 2020-02-15 ENCOUNTER — Ambulatory Visit (AMBULATORY_SURGERY_CENTER): Payer: BC Managed Care – PPO | Admitting: Gastroenterology

## 2020-02-15 ENCOUNTER — Other Ambulatory Visit: Payer: Self-pay

## 2020-02-15 ENCOUNTER — Encounter: Payer: Self-pay | Admitting: Gastroenterology

## 2020-02-15 VITALS — BP 120/80 | HR 66 | Temp 96.9°F | Resp 12 | Ht 64.0 in | Wt 181.0 lb

## 2020-02-15 DIAGNOSIS — D120 Benign neoplasm of cecum: Secondary | ICD-10-CM

## 2020-02-15 DIAGNOSIS — K635 Polyp of colon: Secondary | ICD-10-CM

## 2020-02-15 DIAGNOSIS — Z86010 Personal history of colonic polyps: Secondary | ICD-10-CM

## 2020-02-15 DIAGNOSIS — Z1211 Encounter for screening for malignant neoplasm of colon: Secondary | ICD-10-CM | POA: Diagnosis not present

## 2020-02-15 MED ORDER — SODIUM CHLORIDE 0.9 % IV SOLN: 500.0000 mL | Freq: Once | INTRAVENOUS | Status: DC

## 2020-02-15 NOTE — Patient Instructions (Signed)
Handouts given for hemorrhoids and polyps.  No NSAIDS(aspirin or aspirin-containing products, motrin/advil, ibuprofen,etc) for 2 weeks.  YOU HAD AN ENDOSCOPIC PROCEDURE TODAY AT [facility]:   Refer to the procedure report that was given to you for any specific questions about what was found during the examination.  If the procedure report does not answer your questions, please call your gastroenterologist to clarify.  If you requested that your care partner not be given the details of your procedure findings, then the procedure report has been included in a sealed envelope for you to review at your convenience later.  YOU SHOULD EXPECT: Some feelings of bloating in the abdomen. Passage of more gas than usual.  Walking can help get rid of the air that was put into your GI tract during the procedure and reduce the bloating. If you had a lower endoscopy (such as a colonoscopy or flexible sigmoidoscopy) you may notice spotting of blood in your stool or on the toilet paper. If you underwent a bowel prep for your procedure, you may not have a normal bowel movement for a few days.  Please Note:  You might notice some irritation and congestion in your nose or some drainage.  This is from the oxygen used during your procedure.  There is no need for concern and it should clear up in a day or so.  SYMPTOMS TO REPORT IMMEDIATELY:   Following lower endoscopy (colonoscopy or flexible sigmoidoscopy):  Excessive amounts of blood in the stool  Significant tenderness or worsening of abdominal pains  Swelling of the abdomen that is new, acute  Fever of 100F or higher  For urgent or emergent issues, a gastroenterologist can be reached at any hour by calling [phone]. Do not use MyChart messaging for urgent concerns.    DIET:  We do recommend a small meal at first, but then you may proceed to your regular diet.  Drink plenty of fluids but you should avoid alcoholic beverages for 24  hours.  ACTIVITY:  You should plan to take it easy for the rest of today and you should NOT DRIVE or use heavy machinery until tomorrow (because of the sedation medicines used during the test).    FOLLOW UP: Our staff will call the number listed on your records 48-72 hours following your procedure to check on you and address any questions or concerns that you may have regarding the information given to you following your procedure. If we do not reach you, we will leave a message.  We will attempt to reach you two times.  During this call, we will ask if you have developed any symptoms of COVID 19. If you develop any symptoms (ie: fever, flu-like symptoms, shortness of breath, cough etc.) before then, please call [phone].  If you test positive for Covid 19 in the 2 weeks post procedure, please call and report this information to [place].    If any biopsies were taken you will be contacted by phone or by letter within the next 1-3 weeks.  Please call us at [phone] if you have not heard about the biopsies in 3 weeks.    SIGNATURES/CONFIDENTIALITY: You and/or your care partner have signed paperwork which will be entered into your electronic medical record.  These signatures attest to the fact that that the information above on your After Visit Summary has been reviewed and is understood.  Full responsibility of the confidentiality of this discharge information lies with you and/or your care-partner.

## 2020-02-15 NOTE — Op Note (Signed)
[facility] Patient Name: Jody Thompson Procedure Date: 02/15/2020 9:41 AM MRN: [id number] Endoscopist: Ladene Artist , MD Age: 59 Referring MD:  Date of Birth: 1961/09/17 Gender: Female Account #: [id number] Procedure:                Colonoscopy Indications:              Surveillance: Personal history of adenomatous                            polyps on last colonoscopy > 5 years ago Medicines:                Monitored Anesthesia Care Procedure:                Pre-Anesthesia Assessment:                           - Prior to the procedure, a History and Physical                            was performed, and patient medications and                            allergies were reviewed. The patient's tolerance of                            previous anesthesia was also reviewed. The risks                            and benefits of the procedure and the sedation                            options and risks were discussed with the patient.                            All questions were answered, and informed consent                            was obtained. Prior Anticoagulants: The patient has                            taken no previous anticoagulant or antiplatelet                            agents. ASA Grade Assessment: II - A patient with                            mild systemic disease. After reviewing the risks                            and benefits, the patient was deemed in                            satisfactory condition to undergo the procedure.  After obtaining informed consent, the colonoscope                            was passed under direct vision. Throughout the                            procedure, the patient's blood pressure, pulse, and                            oxygen saturations were monitored continuously. The                            Colonoscope was introduced through the anus and                            advanced to the the  cecum, identified by                            appendiceal orifice and ileocecal valve. The                            ileocecal valve, appendiceal orifice, and rectum                            were photographed. The quality of the bowel                            preparation was good. The colonoscopy was performed                            without difficulty. The patient tolerated the                            procedure well. Scope In: 9:57:14 AM Scope Out: 10:13:43 AM Scope Withdrawal Time: 0 hours 12 minutes 36 seconds  Total Procedure Duration: 0 hours 16 minutes 29 seconds  Findings:                 The perianal and digital rectal examinations were                            normal.                           A 7 mm polyp was found in the cecum. The polyp was                            sessile. The polyp was removed with a hot snare.                            Resection and retrieval were complete.                           Internal hemorrhoids were found during  retroflexion. The hemorrhoids were small and Grade                            I (internal hemorrhoids that do not prolapse).                           The exam was otherwise without abnormality on                            direct and retroflexion views. Complications:            No immediate complications. Estimated blood loss:                            None. Estimated Blood Loss:     Estimated blood loss: none. Impression:               - One 7 mm polyp in the cecum, removed with a hot                            snare. Resected and retrieved.                           - Internal hemorrhoids.                           - The examination was otherwise normal on direct                            and retroflexion views. Recommendation:           - Repeat colonoscopy date to be determined after                            pending pathology results are reviewed for                             surveillance based on pathology results.                           - Patient has a contact number available for                            emergencies. The signs and symptoms of potential                            delayed complications were discussed with the                            patient. Return to normal activities tomorrow.                            Written discharge instructions were provided to the                            patient.                           -  Resume previous diet.                           - Continue present medications.                           - Await pathology results.                           - No aspirin, ibuprofen, naproxen, or other                            non-steroidal anti-inflammatory drugs for 2 weeks                            after polyp removal. Ladene Artist, MD 02/15/2020 10:16:08 AM This report has been signed electronically.

## 2020-02-15 NOTE — Progress Notes (Signed)
Temp by LC, vitals by DT   Pt's states no medical or surgical changes since previsit or office visit.

## 2020-02-15 NOTE — Progress Notes (Signed)
Called to room to assist during endoscopic procedure.  Patient ID and intended procedure confirmed with present staff. Received instructions for my participation in the procedure from the performing physician.  

## 2020-02-15 NOTE — Progress Notes (Signed)
PT taken to PACU. Monitors in place. VSS. Report given to RN. 

## 2020-02-17 ENCOUNTER — Telehealth: Payer: Self-pay

## 2020-02-17 NOTE — Telephone Encounter (Signed)
  Follow up Call-  Call back number 02/15/2020  Post procedure Call Back phone  # [phone]  Permission to leave phone message Yes  Some recent data might be hidden     Patient questions:  Do you have a fever, pain , or abdominal swelling? No. Pain Score  0 *  Have you tolerated food without any problems? Yes.    Have you been able to return to your normal activities? Yes.    Do you have any questions about your discharge instructions: Diet   No. Medications  No. Follow up visit  No.  Do you have questions or concerns about your Care? No.  Actions: * If pain score is 4 or above: No action needed, pain <4. 1. Have you developed a fever since your procedure? no  2.   Have you had an respiratory symptoms (SOB or cough) since your procedure? no  3.   Have you tested positive for COVID 19 since your procedure no  4.   Have you had any family members/close contacts diagnosed with the COVID 19 since your procedure?  no   If yes to any of these questions please route to Joylene John, RN and Alphonsa Gin, Therapist, sports.

## 2020-02-22 DIAGNOSIS — L918 Other hypertrophic disorders of the skin: Secondary | ICD-10-CM | POA: Diagnosis not present

## 2020-02-22 DIAGNOSIS — D225 Melanocytic nevi of trunk: Secondary | ICD-10-CM | POA: Diagnosis not present

## 2020-02-22 DIAGNOSIS — D224 Melanocytic nevi of scalp and neck: Secondary | ICD-10-CM | POA: Diagnosis not present

## 2020-02-22 DIAGNOSIS — D2262 Melanocytic nevi of left upper limb, including shoulder: Secondary | ICD-10-CM | POA: Diagnosis not present

## 2020-02-24 ENCOUNTER — Encounter: Payer: Self-pay | Admitting: Gastroenterology

## 2020-03-16 ENCOUNTER — Other Ambulatory Visit: Payer: Self-pay | Admitting: Internal Medicine

## 2021-01-13 NOTE — Progress Notes (Signed)
Subjective:    Patient ID: Jody Thompson, female    DOB: 08-25-1961, 60 y.o.   MRN: [id number]   This visit occurred during the SARS-CoV-2 public health emergency.  Safety protocols were in place, including screening questions prior to the visit, additional usage of staff PPE, and extensive cleaning of exam room while observing appropriate contact time as indicated for disinfecting solutions.    HPI She is here for a physical exam.     Medications and allergies reviewed with patient and updated if appropriate.  Patient Active Problem List   Diagnosis Date Noted  . Rash and nonspecific skin eruption 01/11/2019  . Hyperlipidemia 01/09/2018  . Prediabetes 01/09/2018  . PAC (premature atrial contraction) 12/29/2017  . White coat syndrome with diagnosis of hypertension 11/17/2013    Current Outpatient Medications on File Prior to Visit  Medication Sig Dispense Refill  . cholecalciferol (VITAMIN D3) 25 MCG (1000 UNIT) tablet Take 1,000 Units by mouth daily.    . Multiple Vitamins-Minerals (MULTIVITAMIN PO) Take by mouth daily.    . vitamin C (ASCORBIC ACID) 250 MG tablet Take 250 mg by mouth daily.    . hydrochlorothiazide (HYDRODIURIL) 25 MG tablet TAKE 1 TABLET(25 MG) BY MOUTH DAILY (Patient not taking: Reported on 01/15/2021) 90 tablet 2   No current facility-administered medications on file prior to visit.    Past Medical History:  Diagnosis Date  . Hypertension     Past Surgical History:  Procedure Laterality Date  . BREAST BIOPSY     left  . COLONOSCOPY  01/28/2013  . POLYPECTOMY      Social History   Socioeconomic History  . Marital status: Married    Spouse name: Not on file  . Number of children: 2  . Years of education: Not on file  . Highest education level: Not on file  Occupational History  . Not on file  Tobacco Use  . Smoking status: Never Smoker  . Smokeless tobacco: Never Used  Vaping Use  . Vaping Use: Never used  Substance and Sexual  Activity  . Alcohol use: Yes    Alcohol/week: 7.0 standard drinks    Types: 7 Glasses of wine per week  . Drug use: No  . Sexual activity: Yes    Comment: husband had vasectomy  Other Topics Concern  . Not on file  Social History Narrative   Non regular exercise   Not working   Married   2 kids, 2 grandkids   Social Determinants of Radio broadcast assistant Strain: Not on file  Food Insecurity: Not on file  Transportation Needs: Not on file  Physical Activity: Not on file  Stress: Not on file  Social Connections: Not on file    Family History  Problem Relation Age of Onset  . Colon cancer Paternal Grandmother   . Arthritis Mother   . Heart disease Mother   . Hypertension Father   . Prostate cancer Father   . Breast cancer Maternal Aunt   . Prostate cancer Brother   . Esophageal cancer Neg Hx   . Stomach cancer Neg Hx   . Rectal cancer Neg Hx     Review of Systems  Constitutional: Negative for chills and fever.  Eyes: Negative for visual disturbance.  Respiratory: Negative for cough, shortness of breath and wheezing.   Cardiovascular: Negative for chest pain, palpitations and leg swelling.  Gastrointestinal: Negative for abdominal pain, blood in stool, constipation, diarrhea and nausea.  No gerd  Genitourinary: Negative for dysuria and hematuria.  Musculoskeletal: Negative for arthralgias and back pain.  Skin: Negative for color change and rash.  Neurological: Negative for dizziness, light-headedness, numbness and headaches.  Psychiatric/Behavioral: Negative for dysphoric mood. The patient is not nervous/anxious.        Objective:   Vitals:   01/15/21 0751  BP: (!) 152/80  Pulse: 85  Temp: 98 F (36.7 C)  SpO2: 99%   Filed Weights   01/15/21 0751  Weight: 172 lb 9.6 oz (78.3 kg)   Body mass index is 29.63 kg/m.  BP Readings from Last 3 Encounters:  01/15/21 (!) 152/80  02/15/20 120/80  01/13/20 (!) 162/94    Wt Readings from Last 3  Encounters:  01/15/21 172 lb 9.6 oz (78.3 kg)  02/15/20 181 lb (82.1 kg)  02/01/20 181 lb (82.1 kg)   Depression screen [facility] 2/9 01/15/2021 01/13/2020 12/29/2017  Decreased Interest 0 0 0  Down, Depressed, Hopeless 0 0 0  PHQ - 2 Score 0 0 0  Altered sleeping 0 - -  Tired, decreased energy 0 - -  Change in appetite 0 - -  Feeling bad or failure about yourself  0 - -  Trouble concentrating 0 - -  Moving slowly or fidgety/restless 0 - -  Suicidal thoughts 0 - -  PHQ-9 Score 0 - -  Difficult doing work/chores Not difficult at all - -      Physical Exam Constitutional: She appears well-developed and well-nourished. No distress.  HENT:  Head: Normocephalic and atraumatic.  Right Ear: External ear normal. Normal ear canal and TM Left Ear: External ear normal.  Normal ear canal and TM Mouth/Throat: Oropharynx is clear and moist.  Eyes: Conjunctivae and EOM are normal.  Neck: Neck supple. No tracheal deviation present. No thyromegaly present.  No carotid bruit  Cardiovascular: Normal rate, regular rhythm and normal heart sounds.   No murmur heard.  No edema. Pulmonary/Chest: Effort normal and breath sounds normal. No respiratory distress. She has no wheezes. She has no rales.  Breast: deferred   Abdominal: Soft. She exhibits no distension. There is no tenderness.  Lymphadenopathy: She has no cervical adenopathy.  Skin: Skin is warm and dry. She is not diaphoretic.  Psychiatric: She has a normal mood and affect. Her behavior is normal.   The 10-year ASCVD risk score Mikey Bussing DC Jr., et al., 2013) is: 5.8%   Values used to calculate the score:     Age: 33 years     Sex: Female     Is Non-Hispanic African American: No     Diabetic: No     Tobacco smoker: No     Systolic Blood Pressure: 259 mmHg     Is BP treated: No     HDL Cholesterol: 48.3 mg/dL     Total Cholesterol: 294 mg/dL      Assessment & Plan:   Physical exam: Screening blood work    ordered Immunizations  Had  shingrix Colonoscopy   Up to date  Mammogram  Up to date  Gyn  Up to date  Eye exams  Due - will schedule Exercise  Yoga, some walking Weight  Has lost weight  Substance abuse  none   Screened for depression using the PHQ 9 scale.  No evidence of depression.     See Problem List for Assessment and Plan of chronic medical problems.

## 2021-01-13 NOTE — Patient Instructions (Addendum)
Blood work was ordered.     Medications changes include :   none     Please followup in 1 year    Health Maintenance, Female Adopting a healthy lifestyle and getting preventive care are important in promoting health and wellness. Ask your health care provider about:  The right schedule for you to have regular tests and exams.  Things you can do on your own to prevent diseases and keep yourself healthy. What should I know about diet, weight, and exercise? Eat a healthy diet  Eat a diet that includes plenty of vegetables, fruits, low-fat dairy products, and lean protein.  Do not eat a lot of foods that are high in solid fats, added sugars, or sodium.   Maintain a healthy weight Body mass index (BMI) is used to identify weight problems. It estimates body fat based on height and weight. Your health care provider can help determine your BMI and help you achieve or maintain a healthy weight. Get regular exercise Get regular exercise. This is one of the most important things you can do for your health. Most adults should:  Exercise for at least 150 minutes each week. The exercise should increase your heart rate and make you sweat (moderate-intensity exercise).  Do strengthening exercises at least twice a week. This is in addition to the moderate-intensity exercise.  Spend less time sitting. Even light physical activity can be beneficial. Watch cholesterol and blood lipids Have your blood tested for lipids and cholesterol at 59 years of age, then have this test every 5 years. Have your cholesterol levels checked more often if:  Your lipid or cholesterol levels are high.  You are older than 59 years of age.  You are at high risk for heart disease. What should I know about cancer screening? Depending on your health history and family history, you may need to have cancer screening at various ages. This may include screening for:  Breast cancer.  Cervical cancer.  Colorectal  cancer.  Skin cancer.  Lung cancer. What should I know about heart disease, diabetes, and high blood pressure? Blood pressure and heart disease  High blood pressure causes heart disease and increases the risk of stroke. This is more likely to develop in people who have high blood pressure readings, are of African descent, or are overweight.  Have your blood pressure checked: ? Every 3-5 years if you are 18-39 years of age. ? Every year if you are 40 years old or older. Diabetes Have regular diabetes screenings. This checks your fasting blood sugar level. Have the screening done:  Once every three years after age 40 if you are at a normal weight and have a low risk for diabetes.  More often and at a younger age if you are overweight or have a high risk for diabetes. What should I know about preventing infection? Hepatitis B If you have a higher risk for hepatitis B, you should be screened for this virus. Talk with your health care provider to find out if you are at risk for hepatitis B infection. Hepatitis C Testing is recommended for:  Everyone born from 1945 through 1965.  Anyone with known risk factors for hepatitis C. Sexually transmitted infections (STIs)  Get screened for STIs, including gonorrhea and chlamydia, if: ? You are sexually active and are younger than 59 years of age. ? You are older than 59 years of age and your health care provider tells you that you are at risk for this type of   infection. ? Your sexual activity has changed since you were last screened, and you are at increased risk for chlamydia or gonorrhea. Ask your health care provider if you are at risk.  Ask your health care provider about whether you are at high risk for HIV. Your health care provider may recommend a prescription medicine to help prevent HIV infection. If you choose to take medicine to prevent HIV, you should first get tested for HIV. You should then be tested every 3 months for as long as  you are taking the medicine. Pregnancy  If you are about to stop having your period (premenopausal) and you may become pregnant, seek counseling before you get pregnant.  Take 400 to 800 micrograms (mcg) of folic acid every day if you become pregnant.  Ask for birth control (contraception) if you want to prevent pregnancy. Osteoporosis and menopause Osteoporosis is a disease in which the bones lose minerals and strength with aging. This can result in bone fractures. If you are 65 years old or older, or if you are at risk for osteoporosis and fractures, ask your health care provider if you should:  Be screened for bone loss.  Take a calcium or vitamin D supplement to lower your risk of fractures.  Be given hormone replacement therapy (HRT) to treat symptoms of menopause. Follow these instructions at home: Lifestyle  Do not use any products that contain nicotine or tobacco, such as cigarettes, e-cigarettes, and chewing tobacco. If you need help quitting, ask your health care provider.  Do not use street drugs.  Do not share needles.  Ask your health care provider for help if you need support or information about quitting drugs. Alcohol use  Do not drink alcohol if: ? Your health care provider tells you not to drink. ? You are pregnant, may be pregnant, or are planning to become pregnant.  If you drink alcohol: ? Limit how much you use to 0-1 drink a day. ? Limit intake if you are breastfeeding.  Be aware of how much alcohol is in your drink. In the [place] one drink equals one 12 oz bottle of beer (355 mL), one 5 oz glass of wine (148 mL), or one 1 oz glass of hard liquor (44 mL). General instructions  Schedule regular health, dental, and eye exams.  Stay current with your vaccines.  Tell your health care provider if: ? You often feel depressed. ? You have ever been abused or do not feel safe at home. Summary  Adopting a healthy lifestyle and getting preventive care are  important in promoting health and wellness.  Follow your health care provider's instructions about healthy diet, exercising, and getting tested or screened for diseases.  Follow your health care provider's instructions on monitoring your cholesterol and blood pressure. This information is not intended to replace advice given to you by your health care provider. Make sure you discuss any questions you have with your health care provider. Document Revised: 11/18/2018 Document Reviewed: 11/18/2018 Elsevier Patient Education  2021 [employer]  

## 2021-01-15 ENCOUNTER — Other Ambulatory Visit: Payer: Self-pay

## 2021-01-15 ENCOUNTER — Ambulatory Visit (INDEPENDENT_AMBULATORY_CARE_PROVIDER_SITE_OTHER): Payer: BC Managed Care – PPO | Admitting: Internal Medicine

## 2021-01-15 ENCOUNTER — Encounter: Payer: Self-pay | Admitting: Internal Medicine

## 2021-01-15 VITALS — BP 144/76 | HR 85 | Temp 98.0°F | Ht 64.0 in | Wt 172.6 lb

## 2021-01-15 DIAGNOSIS — I10 Essential (primary) hypertension: Secondary | ICD-10-CM | POA: Diagnosis not present

## 2021-01-15 DIAGNOSIS — Z0000 Encounter for general adult medical examination without abnormal findings: Secondary | ICD-10-CM

## 2021-01-15 DIAGNOSIS — R7303 Prediabetes: Secondary | ICD-10-CM | POA: Diagnosis not present

## 2021-01-15 DIAGNOSIS — E782 Mixed hyperlipidemia: Secondary | ICD-10-CM | POA: Diagnosis not present

## 2021-01-15 NOTE — Assessment & Plan Note (Signed)
Chronic BP well controlled at home Continue hctz 25 mg daily cmp

## 2021-01-15 NOTE — Assessment & Plan Note (Signed)
Chronic Check a1c Low sugar / carb diet Stressed regular exercise  

## 2021-01-15 NOTE — Assessment & Plan Note (Addendum)
Chronic Hyperlipidemia likely genetic Check lipid panel, cmp, tsh  Regular exercise and healthy diet encouraged Work on weight loss  The 10-year ASCVD risk score Mikey Bussing DC Jr., et al., 2013) is: 5.8%   Values used to calculate the score:     Age: 60 years     Sex: Female     Is Non-Hispanic African American: No     Diabetic: No     Tobacco smoker: No     Systolic Blood Pressure: 616 mmHg     Is BP treated: No     HDL Cholesterol: 48.3 mg/dL     Total Cholesterol: 294 mg/dL  Discussed increased risk and discussed needing a statin at some point - she would like to avoid discussed coronary artery Ct at some point Reviewed family history  - no siblings with significant CAD or hyperlipidemia  She will work on lifestyle - consider rechecking lipids in a few months

## 2021-01-23 ENCOUNTER — Other Ambulatory Visit: Payer: Self-pay

## 2021-01-23 ENCOUNTER — Other Ambulatory Visit (INDEPENDENT_AMBULATORY_CARE_PROVIDER_SITE_OTHER): Payer: BC Managed Care – PPO

## 2021-01-23 DIAGNOSIS — I10 Essential (primary) hypertension: Secondary | ICD-10-CM | POA: Diagnosis not present

## 2021-01-23 DIAGNOSIS — E782 Mixed hyperlipidemia: Secondary | ICD-10-CM | POA: Diagnosis not present

## 2021-01-23 DIAGNOSIS — Z0000 Encounter for general adult medical examination without abnormal findings: Secondary | ICD-10-CM | POA: Diagnosis not present

## 2021-01-23 DIAGNOSIS — R7303 Prediabetes: Secondary | ICD-10-CM | POA: Diagnosis not present

## 2021-01-23 LAB — COMPREHENSIVE METABOLIC PANEL
ALT: 17 U/L (ref 0–35)
AST: 18 U/L (ref 0–37)
Albumin: 4.3 g/dL (ref 3.5–5.2)
Alkaline Phosphatase: 70 U/L (ref 39–117)
BUN: 8 mg/dL (ref 6–23)
CO2: 31 mEq/L (ref 19–32)
Calcium: 9.6 mg/dL (ref 8.4–10.5)
Chloride: 104 mEq/L (ref 96–112)
Creatinine, Ser: 0.77 mg/dL (ref 0.40–1.20)
GFR: 84.95 mL/min (ref >60.00)
Glucose, Bld: 94 mg/dL (ref 70–99)
Potassium: 4.6 mEq/L (ref 3.5–5.1)
Sodium: 139 mEq/L (ref 135–145)
Total Bilirubin: 0.5 mg/dL (ref 0.2–1.2)
Total Protein: 7.1 g/dL (ref 6.0–8.3)

## 2021-01-23 LAB — CBC WITH DIFFERENTIAL/PLATELET
Basophils Absolute: 0.1 10*3/uL (ref 0.0–0.1)
Basophils Relative: 1.2 % (ref 0.0–3.0)
Eosinophils Absolute: 0.2 10*3/uL (ref 0.0–0.7)
Eosinophils Relative: 4.2 % (ref 0.0–5.0)
HCT: 42.5 % (ref 36.0–46.0)
Hemoglobin: 14 g/dL (ref 12.0–15.0)
Lymphocytes Relative: 38.7 % (ref 12.0–46.0)
Lymphs Abs: 1.9 10*3/uL (ref 0.7–4.0)
MCHC: 33 g/dL (ref 30.0–36.0)
MCV: 91.7 fl (ref 78.0–100.0)
Monocytes Absolute: 0.4 10*3/uL (ref 0.1–1.0)
Monocytes Relative: 8.2 % (ref 3.0–12.0)
Neutro Abs: 2.4 10*3/uL (ref 1.4–7.7)
Neutrophils Relative %: 47.7 % (ref 43.0–77.0)
Platelets: 228 10*3/uL (ref 150.0–400.0)
RBC: 4.63 Mil/uL (ref 3.87–5.11)
RDW: 14.3 % (ref 11.5–15.5)
WBC: 5 10*3/uL (ref 4.0–10.5)

## 2021-01-23 LAB — LIPID PANEL
Cholesterol: 338 mg/dL — ABNORMAL HIGH (ref 0–200)
HDL: 55.9 mg/dL (ref >39.00)
NonHDL: 281.91
Total CHOL/HDL Ratio: 6
Triglycerides: 330 mg/dL — ABNORMAL HIGH (ref 0.0–149.0)
VLDL: 66 mg/dL — ABNORMAL HIGH (ref 0.0–40.0)

## 2021-01-23 LAB — HEMOGLOBIN A1C: Hgb A1c MFr Bld: 5.6 % (ref 4.6–6.5)

## 2021-01-23 LAB — LDL CHOLESTEROL, DIRECT: Direct LDL: 228 mg/dL

## 2021-01-23 LAB — TSH: TSH: 2.78 u[IU]/mL (ref 0.35–4.50)

## 2021-03-16 DIAGNOSIS — Z1231 Encounter for screening mammogram for malignant neoplasm of breast: Secondary | ICD-10-CM | POA: Diagnosis not present

## 2021-03-16 DIAGNOSIS — Z6832 Body mass index (BMI) 32.0-32.9, adult: Secondary | ICD-10-CM | POA: Diagnosis not present

## 2021-03-16 DIAGNOSIS — Z01419 Encounter for gynecological examination (general) (routine) without abnormal findings: Secondary | ICD-10-CM | POA: Diagnosis not present

## 2022-01-16 ENCOUNTER — Encounter: Payer: Self-pay | Admitting: Internal Medicine

## 2022-01-16 NOTE — Patient Instructions (Addendum)
Blood work was ordered.      Medications changes include :   None    Please followup in 1 year   Health Maintenance, Female Adopting a healthy lifestyle and getting preventive care are important in promoting health and wellness. Ask your health care provider about: The right schedule for you to have regular tests and exams. Things you can do on your own to prevent diseases and keep yourself healthy. What should I know about diet, weight, and exercise? Eat a healthy diet  Eat a diet that includes plenty of vegetables, fruits, low-fat dairy products, and lean protein. Do not eat a lot of foods that are high in solid fats, added sugars, or sodium. Maintain a healthy weight Body mass index (BMI) is used to identify weight problems. It estimates body fat based on height and weight. Your health care provider can help determine your BMI and help you achieve or maintain a healthy weight. Get regular exercise Get regular exercise. This is one of the most important things you can do for your health. Most adults should: Exercise for at least 150 minutes each week. The exercise should increase your heart rate and make you sweat (moderate-intensity exercise). Do strengthening exercises at least twice a week. This is in addition to the moderate-intensity exercise. Spend less time sitting. Even light physical activity can be beneficial. Watch cholesterol and blood lipids Have your blood tested for lipids and cholesterol at 60 years of age, then have this test every 5 years. Have your cholesterol levels checked more often if: Your lipid or cholesterol levels are high. You are older than 60 years of age. You are at high risk for heart disease. What should I know about cancer screening? Depending on your health history and family history, you may need to have cancer screening at various ages. This may include screening for: Breast cancer. Cervical cancer. Colorectal cancer. Skin cancer. Lung  cancer. What should I know about heart disease, diabetes, and high blood pressure? Blood pressure and heart disease High blood pressure causes heart disease and increases the risk of stroke. This is more likely to develop in people who have high blood pressure readings or are overweight. Have your blood pressure checked: Every 3-5 years if you are 60-76 years of age. Every year if you are 66 years old or older. Diabetes Have regular diabetes screenings. This checks your fasting blood sugar level. Have the screening done: Once every three years after age 48 if you are at a normal weight and have a low risk for diabetes. More often and at a younger age if you are overweight or have a high risk for diabetes. What should I know about preventing infection? Hepatitis B If you have a higher risk for hepatitis B, you should be screened for this virus. Talk with your health care provider to find out if you are at risk for hepatitis B infection. Hepatitis C Testing is recommended for: Everyone born from [birth year] through 1965. Anyone with known risk factors for hepatitis C. Sexually transmitted infections (STIs) Get screened for STIs, including gonorrhea and chlamydia, if: You are sexually active and are younger than 60 years of age. You are older than 60 years of age and your health care provider tells you that you are at risk for this type of infection. Your sexual activity has changed since you were last screened, and you are at increased risk for chlamydia or gonorrhea. Ask your health care provider if you are at risk. Ask  your health care provider about whether you are at high risk for HIV. Your health care provider may recommend a prescription medicine to help prevent HIV infection. If you choose to take medicine to prevent HIV, you should first get tested for HIV. You should then be tested every 3 months for as long as you are taking the medicine. Pregnancy If you are about to stop having your  period (premenopausal) and you may become pregnant, seek counseling before you get pregnant. Take 400 to 800 micrograms (mcg) of folic acid every day if you become pregnant. Ask for birth control (contraception) if you want to prevent pregnancy. Osteoporosis and menopause Osteoporosis is a disease in which the bones lose minerals and strength with aging. This can result in bone fractures. If you are 45 years old or older, or if you are at risk for osteoporosis and fractures, ask your health care provider if you should: Be screened for bone loss. Take a calcium or vitamin D supplement to lower your risk of fractures. Be given hormone replacement therapy (HRT) to treat symptoms of menopause. Follow these instructions at home: Alcohol use Do not drink alcohol if: Your health care provider tells you not to drink. You are pregnant, may be pregnant, or are planning to become pregnant. If you drink alcohol: Limit how much you have to: 0-1 drink a day. Know how much alcohol is in your drink. In the [place] one drink equals one 12 oz bottle of beer (355 mL), one 5 oz glass of wine (148 mL), or one 1 oz glass of hard liquor (44 mL). Lifestyle Do not use any products that contain nicotine or tobacco. These products include cigarettes, chewing tobacco, and vaping devices, such as e-cigarettes. If you need help quitting, ask your health care provider. Do not use street drugs. Do not share needles. Ask your health care provider for help if you need support or information about quitting drugs. General instructions Schedule regular health, dental, and eye exams. Stay current with your vaccines. Tell your health care provider if: You often feel depressed. You have ever been abused or do not feel safe at home. Summary Adopting a healthy lifestyle and getting preventive care are important in promoting health and wellness. Follow your health care provider's instructions about healthy diet, exercising, and  getting tested or screened for diseases. Follow your health care provider's instructions on monitoring your cholesterol and blood pressure. This information is not intended to replace advice given to you by your health care provider. Make sure you discuss any questions you have with your health care provider. Document Revised: 04/16/2021 Document Reviewed: 04/16/2021 Elsevier Patient Education  [place].

## 2022-01-16 NOTE — Progress Notes (Signed)
Subjective:    Patient ID: Jody Thompson, female    DOB: [birth year], 61 y.o.   MRN: [id number]   This visit occurred during the SARS-CoV-2 public health emergency.  Safety protocols were in place, including screening questions prior to the visit, additional usage of staff PPE, and extensive cleaning of exam room while observing appropriate contact time as indicated for disinfecting solutions.    HPI She is here for a physical exam.   Last year her mother died and it has been a rough few months since then.  Overall physically she feels well.  She knows she needs to work on her weight.  She monitors her blood pressure at home and is well controlled.  Medications and allergies reviewed with patient and updated if appropriate.  Patient Active Problem List   Diagnosis Date Noted   Hyperlipidemia 01/09/2018   Prediabetes 01/09/2018   PAC (premature atrial contraction) 12/29/2017   White coat syndrome with diagnosis of hypertension 11/17/2013    Current Outpatient Medications on File Prior to Visit  Medication Sig Dispense Refill   cholecalciferol (VITAMIN D3) 25 MCG (1000 UNIT) tablet Take 1,000 Units by mouth daily.     Multiple Vitamins-Minerals (MULTIVITAMIN PO) Take by mouth daily.     vitamin C (ASCORBIC ACID) 250 MG tablet Take 250 mg by mouth daily.     No current facility-administered medications on file prior to visit.    Past Medical History:  Diagnosis Date   Hypertension     Past Surgical History:  Procedure Laterality Date   BREAST BIOPSY     left   COLONOSCOPY  01/28/2013   POLYPECTOMY      Social History   Socioeconomic History   Marital status: Married    Spouse name: Not on file   Number of children: 2   Years of education: Not on file   Highest education level: Not on file  Occupational History   Not on file  Tobacco Use   Smoking status: Never   Smokeless tobacco: Never  Vaping Use   Vaping Use: Never used  Substance and Sexual  Activity   Alcohol use: Yes    Alcohol/week: 7.0 standard drinks    Types: 7 Glasses of wine per week   Drug use: No   Sexual activity: Yes    Comment: husband had vasectomy  Other Topics Concern   Not on file  Social History Narrative   Non regular exercise   Not working   Married   2 kids, 2 grandkids   Social Determinants of Radio broadcast assistant Strain: Not on file  Food Insecurity: Not on file  Transportation Needs: Not on file  Physical Activity: Not on file  Stress: Not on file  Social Connections: Not on file    Family History  Problem Relation Age of Onset   Arthritis Mother    Heart disease Mother    Dementia Mother    Hypertension Father    Prostate cancer Father    Hypertension Sister    Prostate cancer Brother    Breast cancer Maternal Aunt    Colon cancer Paternal Grandmother    Esophageal cancer Neg Hx    Stomach cancer Neg Hx    Rectal cancer Neg Hx     Review of Systems  Constitutional:  Negative for chills and fever.  Eyes:  Negative for visual disturbance.  Respiratory:  Negative for cough, shortness of breath and wheezing.   Cardiovascular:  Negative for  chest pain, palpitations and leg swelling.  Gastrointestinal:  Negative for abdominal pain, blood in stool, constipation, diarrhea and nausea.       No gerd  Genitourinary:  Negative for dysuria and hematuria.  Musculoskeletal:  Negative for arthralgias and back pain.  Skin:  Negative for color change and rash.  Neurological:  Negative for dizziness, light-headedness and headaches.  Psychiatric/Behavioral:  Negative for dysphoric mood. The patient is not nervous/anxious.       Objective:   Vitals:   01/17/22 0750  BP: (!) 156/88  Pulse: 90  Temp: 98.2 F (36.8 C)  SpO2: 98%   Filed Weights   01/17/22 0750  Weight: 171 lb (77.6 kg)   Body mass index is 29.35 kg/m.  BP Readings from Last 3 Encounters:  01/17/22 (!) 156/88  01/15/21 (!) 144/76  02/15/20 120/80    Wt  Readings from Last 3 Encounters:  01/17/22 171 lb (77.6 kg)  01/15/21 172 lb 9.6 oz (78.3 kg)  02/15/20 181 lb (82.1 kg)    Depression screen [facility] 2/9 01/15/2021 01/13/2020 12/29/2017  Decreased Interest 0 0 0  Down, Depressed, Hopeless 0 0 0  PHQ - 2 Score 0 0 0  Altered sleeping 0 - -  Tired, decreased energy 0 - -  Change in appetite 0 - -  Feeling bad or failure about yourself  0 - -  Trouble concentrating 0 - -  Moving slowly or fidgety/restless 0 - -  Suicidal thoughts 0 - -  PHQ-9 Score 0 - -  Difficult doing work/chores Not difficult at all - -     No flowsheet data found.     Physical Exam Constitutional: She appears well-developed and well-nourished. No distress.  HENT:  Head: Normocephalic and atraumatic.  Right Ear: External ear normal. Normal ear canal and TM Left Ear: External ear normal.  Normal ear canal and TM Mouth/Throat: Oropharynx is clear and moist.  Eyes: Conjunctivae and EOM are normal.  Neck: Neck supple. No tracheal deviation present. No thyromegaly present.  No carotid bruit  Cardiovascular: Normal rate, regular rhythm and normal heart sounds.   No murmur heard.  No edema. Pulmonary/Chest: Effort normal and breath sounds normal. No respiratory distress. She has no wheezes. She has no rales.  Breast: deferred   Abdominal: Soft. She exhibits no distension. There is no tenderness.  Lymphadenopathy: She has no cervical adenopathy.  Skin: Skin is warm and dry. She is not diaphoretic.  Psychiatric: She has a normal mood and affect. Her behavior is normal.     Lab Results  Component Value Date   WBC 5.0 01/23/2021   HGB 14.0 01/23/2021   HCT 42.5 01/23/2021   PLT 228.0 01/23/2021   GLUCOSE 94 01/23/2021   CHOL 338 (H) 01/23/2021   TRIG 330.0 (H) 01/23/2021   HDL 55.90 01/23/2021   LDLDIRECT 228.0 01/23/2021   ALT 17 01/23/2021   AST 18 01/23/2021   NA 139 01/23/2021   K 4.6 01/23/2021   CL 104 01/23/2021   CREATININE 0.77 01/23/2021   BUN  8 01/23/2021   CO2 31 01/23/2021   TSH 2.78 01/23/2021   HGBA1C 5.6 01/23/2021         Assessment & Plan:   Physical exam: Screening blood work  ordered Exercise  yoga Weight  working on weight loss Substance abuse  none   Reviewed recommended immunizations.   Health Maintenance  Topic Date Due   COVID-19 Vaccine (5 - Booster for [employer] series) 02/02/2022 (Originally  07/01/2021)   Hepatitis C Screening  01/11/2049 (Originally 07/04/1980)   HIV Screening  01/11/2049 (Originally 07/04/1977)   MAMMOGRAM  03/17/2023   TETANUS/TDAP  11/18/2023   PAP SMEAR-Modifier  12/24/2023   COLONOSCOPY (Pts 45-24yrs Insurance coverage will need to be confirmed)  02/15/2027   INFLUENZA VACCINE  Completed   HPV VACCINES  Aged Out   Zoster Vaccines- Shingrix  Discontinued          See Problem List for Assessment and Plan of chronic medical problems.

## 2022-01-17 ENCOUNTER — Other Ambulatory Visit: Payer: Self-pay

## 2022-01-17 ENCOUNTER — Encounter: Payer: Self-pay | Admitting: Internal Medicine

## 2022-01-17 ENCOUNTER — Ambulatory Visit (INDEPENDENT_AMBULATORY_CARE_PROVIDER_SITE_OTHER): Payer: BC Managed Care – PPO | Admitting: Internal Medicine

## 2022-01-17 VITALS — BP 156/88 | HR 90 | Temp 98.2°F | Ht 64.0 in | Wt 171.0 lb

## 2022-01-17 DIAGNOSIS — R7303 Prediabetes: Secondary | ICD-10-CM

## 2022-01-17 DIAGNOSIS — I10 Essential (primary) hypertension: Secondary | ICD-10-CM

## 2022-01-17 DIAGNOSIS — Z0000 Encounter for general adult medical examination without abnormal findings: Secondary | ICD-10-CM | POA: Diagnosis not present

## 2022-01-17 DIAGNOSIS — E782 Mixed hyperlipidemia: Secondary | ICD-10-CM

## 2022-01-17 MED ORDER — HYDROCORTISONE 2.5 % EX CREA: TOPICAL_CREAM | Freq: Two times a day (BID) | CUTANEOUS | 0 refills | Status: DC

## 2022-01-17 NOTE — Assessment & Plan Note (Signed)
Chronic Regular exercise and healthy diet encouraged Check lipid panel  Diet controlled

## 2022-01-17 NOTE — Assessment & Plan Note (Signed)
Chronic Check a1c Low sugar / carb diet Stressed regular exercise  

## 2022-01-17 NOTE — Assessment & Plan Note (Signed)
Chronic Has had whitecoat hypertension for years Blood pressure is always been well controlled at home Stressed that she needs to monitor this closely because we do not want to miss her having high blood pressure and had consequences from uncontrolled blood pressure-advise monitoring it closely over the next couple of weeks to make sure it is consistently less than 140/90-ideally lower

## 2022-01-23 ENCOUNTER — Other Ambulatory Visit: Payer: Self-pay

## 2022-01-23 ENCOUNTER — Other Ambulatory Visit (INDEPENDENT_AMBULATORY_CARE_PROVIDER_SITE_OTHER): Payer: BC Managed Care – PPO

## 2022-01-23 DIAGNOSIS — E782 Mixed hyperlipidemia: Secondary | ICD-10-CM | POA: Diagnosis not present

## 2022-01-23 DIAGNOSIS — Z0000 Encounter for general adult medical examination without abnormal findings: Secondary | ICD-10-CM

## 2022-01-23 DIAGNOSIS — R7303 Prediabetes: Secondary | ICD-10-CM | POA: Diagnosis not present

## 2022-01-23 LAB — CBC WITH DIFFERENTIAL/PLATELET
Basophils Absolute: 0.1 10*3/uL (ref 0.0–0.1)
Basophils Relative: 1.2 % (ref 0.0–3.0)
Eosinophils Absolute: 0.2 10*3/uL (ref 0.0–0.7)
Eosinophils Relative: 3.7 % (ref 0.0–5.0)
HCT: 42 % (ref 36.0–46.0)
Hemoglobin: 13.8 g/dL (ref 12.0–15.0)
Lymphocytes Relative: 39.8 % (ref 12.0–46.0)
Lymphs Abs: 1.7 10*3/uL (ref 0.7–4.0)
MCHC: 32.9 g/dL (ref 30.0–36.0)
MCV: 94 fl (ref 78.0–100.0)
Monocytes Absolute: 0.3 10*3/uL (ref 0.1–1.0)
Monocytes Relative: 7.9 % (ref 3.0–12.0)
Neutro Abs: 2 10*3/uL (ref 1.4–7.7)
Neutrophils Relative %: 47.4 % (ref 43.0–77.0)
Platelets: 242 10*3/uL (ref 150.0–400.0)
RBC: 4.47 Mil/uL (ref 3.87–5.11)
RDW: 13.2 % (ref 11.5–15.5)
WBC: 4.3 10*3/uL (ref 4.0–10.5)

## 2022-01-23 LAB — LIPID PANEL
Cholesterol: 325 mg/dL — ABNORMAL HIGH (ref 0–200)
HDL: 64.5 mg/dL (ref >39.00)
LDL Cholesterol: 228 mg/dL — ABNORMAL HIGH (ref 0–99)
NonHDL: 260.87
Total CHOL/HDL Ratio: 5
Triglycerides: 163 mg/dL — ABNORMAL HIGH (ref 0.0–149.0)
VLDL: 32.6 mg/dL (ref 0.0–40.0)

## 2022-01-23 LAB — COMPREHENSIVE METABOLIC PANEL
ALT: 17 U/L (ref 0–35)
AST: 21 U/L (ref 0–37)
Albumin: 4.6 g/dL (ref 3.5–5.2)
Alkaline Phosphatase: 65 U/L (ref 39–117)
BUN: 8 mg/dL (ref 6–23)
CO2: 23 mEq/L (ref 19–32)
Calcium: 9.6 mg/dL (ref 8.4–10.5)
Chloride: 103 mEq/L (ref 96–112)
Creatinine, Ser: 0.72 mg/dL (ref 0.40–1.20)
GFR: 91.43 mL/min (ref >60.00)
Glucose, Bld: 83 mg/dL (ref 70–99)
Potassium: 3.5 mEq/L (ref 3.5–5.1)
Sodium: 140 mEq/L (ref 135–145)
Total Bilirubin: 0.7 mg/dL (ref 0.2–1.2)
Total Protein: 7.8 g/dL (ref 6.0–8.3)

## 2022-01-23 LAB — HEMOGLOBIN A1C: Hgb A1c MFr Bld: 5.7 % (ref 4.6–6.5)

## 2022-01-23 LAB — TSH: TSH: 2.68 u[IU]/mL (ref 0.35–5.50)

## 2022-06-12 DIAGNOSIS — Z6830 Body mass index (BMI) 30.0-30.9, adult: Secondary | ICD-10-CM | POA: Diagnosis not present

## 2022-06-12 DIAGNOSIS — Z01419 Encounter for gynecological examination (general) (routine) without abnormal findings: Secondary | ICD-10-CM | POA: Diagnosis not present

## 2022-06-12 DIAGNOSIS — Z124 Encounter for screening for malignant neoplasm of cervix: Secondary | ICD-10-CM | POA: Diagnosis not present

## 2022-06-12 DIAGNOSIS — Z1231 Encounter for screening mammogram for malignant neoplasm of breast: Secondary | ICD-10-CM | POA: Diagnosis not present

## 2022-06-12 DIAGNOSIS — Z13820 Encounter for screening for osteoporosis: Secondary | ICD-10-CM | POA: Diagnosis not present

## 2022-06-12 DIAGNOSIS — Z1151 Encounter for screening for human papillomavirus (HPV): Secondary | ICD-10-CM | POA: Diagnosis not present

## 2022-06-13 LAB — HM PAP SMEAR

## 2022-10-24 NOTE — Progress Notes (Signed)
    Subjective:    Patient ID: Jody Thompson, female    DOB: 12/16/1960, 61 y.o.   MRN: [id number]      HPI Jody Thompson is here for  Chief Complaint  Patient presents with   Hypertension    Patient was seen at the dentist on Wednesday and BP was too high; Here to follow up    Elevated bp -she was at the dentist recently blood pressure was in the 180s.  She has had high blood pressure here consistently, but it had been controlled at home.  She states she is not checking on a regular basis at home.  She does have a family history of hypertension.  She denies any chest pain, palpitations, shortness of breath, leg swelling, headaches, lightheadedness or dizziness.     Medications and allergies reviewed with patient and updated if appropriate.  Current Outpatient Medications on File Prior to Visit  Medication Sig Dispense Refill   hydrocortisone 2.5 % cream Apply topically 2 (two) times daily. 30 g 0   Multiple Vitamins-Minerals (MULTIVITAMIN PO) Take by mouth daily.     No current facility-administered medications on file prior to visit.    Review of Systems  Eyes:  Negative for visual disturbance.  Respiratory:  Negative for shortness of breath.   Cardiovascular:  Negative for chest pain, palpitations and leg swelling.  Neurological:  Negative for dizziness, light-headedness and headaches.       Objective:   Vitals:   10/25/22 0807  BP: (!) 180/90  Pulse: 93  Temp: 98.4 F (36.9 C)  SpO2: 99%   BP Readings from Last 3 Encounters:  10/25/22 (!) 180/90  01/17/22 (!) 156/88  01/15/21 (!) 144/76   Wt Readings from Last 3 Encounters:  10/25/22 174 lb (78.9 kg)  01/17/22 171 lb (77.6 kg)  01/15/21 172 lb 9.6 oz (78.3 kg)   Body mass index is 29.87 kg/m.    Physical Exam Constitutional:      General: She is not in acute distress.    Appearance: Normal appearance.  HENT:     Head: Normocephalic and atraumatic.  Eyes:     Conjunctiva/sclera: Conjunctivae  normal.  Cardiovascular:     Rate and Rhythm: Normal rate and regular rhythm.     Heart sounds: Normal heart sounds. No murmur heard. Pulmonary:     Effort: Pulmonary effort is normal. No respiratory distress.     Breath sounds: Normal breath sounds. No wheezing.  Musculoskeletal:     Cervical back: Neck supple.     Right lower leg: No edema.     Left lower leg: No edema.  Lymphadenopathy:     Cervical: No cervical adenopathy.  Skin:    General: Skin is warm and dry.     Findings: No rash.  Neurological:     Mental Status: She is alert. Mental status is at baseline.  Psychiatric:        Mood and Affect: Mood normal.        Behavior: Behavior normal.            Assessment & Plan:    See Problem List for Assessment and Plan of chronic medical problems.

## 2022-10-25 ENCOUNTER — Ambulatory Visit (INDEPENDENT_AMBULATORY_CARE_PROVIDER_SITE_OTHER): Payer: BC Managed Care – PPO | Admitting: Internal Medicine

## 2022-10-25 ENCOUNTER — Encounter: Payer: Self-pay | Admitting: Internal Medicine

## 2022-10-25 VITALS — BP 150/86 | HR 93 | Temp 98.4°F | Ht 64.0 in | Wt 174.0 lb

## 2022-10-25 DIAGNOSIS — I10 Essential (primary) hypertension: Secondary | ICD-10-CM

## 2022-10-25 MED ORDER — LISINOPRIL-HYDROCHLOROTHIAZIDE 10-12.5 MG PO TABS: 1.0000 | ORAL_TABLET | Freq: Every day | ORAL | 5 refills | Status: DC

## 2022-10-25 NOTE — Assessment & Plan Note (Addendum)
Has had elevated BP here for a while, but BP was controlled at home Blood pressure elevated at dentist office Asymptomatic Start lisinopril-hydrochlorothiazide 10-12.5 mg-start with 1/2 pill daily for several days and increase to 1 full pill if BP is not consistently less than 130/80 Increase exercise, work on weight loss, low-sodium diet Follow-up in 3-4 weeks-we will do EKG and blood work at that time

## 2022-10-25 NOTE — Patient Instructions (Addendum)
       Medications changes include :   lisinopril - hctz 10-12.5 mg - star with 1/2 pill daily - increase to 1 pill daily for goal of <130/80     Return for 3-4 weeks, hypertension.

## 2022-10-28 ENCOUNTER — Encounter: Payer: Self-pay | Admitting: Internal Medicine

## 2022-10-29 MED ORDER — LISINOPRIL-HYDROCHLOROTHIAZIDE 10-12.5 MG PO TABS: 0.5000 | ORAL_TABLET | Freq: Every day | ORAL | 5 refills | Status: DC

## 2022-11-18 ENCOUNTER — Encounter: Payer: Self-pay | Admitting: Internal Medicine

## 2022-11-18 NOTE — Progress Notes (Unsigned)
      Subjective:    Patient ID: Jody Thompson, female    DOB: Apr 22, 1961, 61 y.o.   MRN: [id number]     HPI Chandrea is here for follow up of her chronic medical problems, including htn  She is taking half of her pill daily and her blood pressure at home has been well-controlled.  Blood pressure is elevated here today and she is not sure why.  She did bring her readings from home.  Medications and allergies reviewed with patient and updated if appropriate.  Current Outpatient Medications on File Prior to Visit  Medication Sig Dispense Refill   hydrocortisone 2.5 % cream Apply topically 2 (two) times daily. 30 g 0   lisinopril-hydrochlorothiazide (ZESTORETIC) 10-12.5 MG tablet Take 0.5 tablets by mouth daily. 30 tablet 5   Multiple Vitamins-Minerals (MULTIVITAMIN PO) Take by mouth daily.     No current facility-administered medications on file prior to visit.     Review of Systems  Respiratory:  Negative for shortness of breath.   Cardiovascular:  Negative for chest pain, palpitations and leg swelling.  Neurological:  Negative for light-headedness and headaches.       Objective:   Vitals:   11/19/22 0753 11/19/22 0812  BP: (!) 160/90 (!) 140/80  Pulse: 82   Temp: 98.1 F (36.7 C)   SpO2: 99%    BP Readings from Last 3 Encounters:  11/19/22 (!) 140/80  10/25/22 (!) 150/86  01/17/22 (!) 156/88   Wt Readings from Last 3 Encounters:  10/25/22 174 lb (78.9 kg)  01/17/22 171 lb (77.6 kg)  01/15/21 172 lb 9.6 oz (78.3 kg)   Body mass index is 29.87 kg/m.    Physical Exam Constitutional:      General: She is not in acute distress.    Appearance: Normal appearance.  HENT:     Head: Normocephalic and atraumatic.  Cardiovascular:     Rate and Rhythm: Normal rate and regular rhythm.     Heart sounds: Normal heart sounds. No murmur heard. Pulmonary:     Effort: Pulmonary effort is normal. No respiratory distress.     Breath sounds: Normal breath sounds. No  wheezing.  Musculoskeletal:     Right lower leg: No edema.     Left lower leg: No edema.  Skin:    General: Skin is warm and dry.     Findings: No rash.  Neurological:     Mental Status: She is alert.        Lab Results  Component Value Date   WBC 5.3 11/19/2022   HGB 14.2 11/19/2022   HCT 42.5 11/19/2022   PLT 267.0 11/19/2022   GLUCOSE 109 (H) 11/19/2022   CHOL 356 (H) 11/19/2022   TRIG 257.0 (H) 11/19/2022   HDL 58.20 11/19/2022   LDLDIRECT 253.0 11/19/2022   LDLCALC 228 (H) 01/23/2022   ALT 19 11/19/2022   AST 20 11/19/2022   NA 139 11/19/2022   K 4.4 11/19/2022   CL 102 11/19/2022   CREATININE 0.78 11/19/2022   BUN 11 11/19/2022   CO2 28 11/19/2022   TSH 2.44 11/19/2022   HGBA1C 6.1 11/19/2022     Assessment & Plan:    See Problem List for Assessment and Plan of chronic medical problems.

## 2022-11-18 NOTE — Patient Instructions (Addendum)
      An EKG was done today.   Blood work was ordered.   The lab is on the first floor.    Medications changes include :       A referral was ordered for XXX.     Someone will call you to schedule an appointment.    No follow-ups on file.

## 2022-11-19 ENCOUNTER — Ambulatory Visit (INDEPENDENT_AMBULATORY_CARE_PROVIDER_SITE_OTHER): Payer: BC Managed Care – PPO | Admitting: Internal Medicine

## 2022-11-19 ENCOUNTER — Other Ambulatory Visit: Payer: Self-pay

## 2022-11-19 VITALS — BP 140/80 | HR 82 | Temp 98.1°F | Ht 64.0 in

## 2022-11-19 DIAGNOSIS — R7303 Prediabetes: Secondary | ICD-10-CM

## 2022-11-19 DIAGNOSIS — I10 Essential (primary) hypertension: Secondary | ICD-10-CM

## 2022-11-19 DIAGNOSIS — E782 Mixed hyperlipidemia: Secondary | ICD-10-CM

## 2022-11-19 LAB — CBC WITH DIFFERENTIAL/PLATELET
Basophils Absolute: 0.1 10*3/uL (ref 0.0–0.1)
Basophils Relative: 1 % (ref 0.0–3.0)
Eosinophils Absolute: 0.2 10*3/uL (ref 0.0–0.7)
Eosinophils Relative: 3.7 % (ref 0.0–5.0)
HCT: 42.5 % (ref 36.0–46.0)
Hemoglobin: 14.2 g/dL (ref 12.0–15.0)
Lymphocytes Relative: 37.4 % (ref 12.0–46.0)
Lymphs Abs: 2 10*3/uL (ref 0.7–4.0)
MCHC: 33.4 g/dL (ref 30.0–36.0)
MCV: 94.2 fl (ref 78.0–100.0)
Monocytes Absolute: 0.4 10*3/uL (ref 0.1–1.0)
Monocytes Relative: 7.9 % (ref 3.0–12.0)
Neutro Abs: 2.6 10*3/uL (ref 1.4–7.7)
Neutrophils Relative %: 50 % (ref 43.0–77.0)
Platelets: 267 10*3/uL (ref 150.0–400.0)
RBC: 4.51 Mil/uL (ref 3.87–5.11)
RDW: 13.8 % (ref 11.5–15.5)
WBC: 5.3 10*3/uL (ref 4.0–10.5)

## 2022-11-19 LAB — LDL CHOLESTEROL, DIRECT: Direct LDL: 253 mg/dL

## 2022-11-19 LAB — COMPREHENSIVE METABOLIC PANEL
ALT: 19 U/L (ref 0–35)
AST: 20 U/L (ref 0–37)
Albumin: 4.6 g/dL (ref 3.5–5.2)
Alkaline Phosphatase: 66 U/L (ref 39–117)
BUN: 11 mg/dL (ref 6–23)
CO2: 28 mEq/L (ref 19–32)
Calcium: 9.8 mg/dL (ref 8.4–10.5)
Chloride: 102 mEq/L (ref 96–112)
Creatinine, Ser: 0.78 mg/dL (ref 0.40–1.20)
GFR: 82.58 mL/min (ref >60.00)
Glucose, Bld: 109 mg/dL — ABNORMAL HIGH (ref 70–99)
Potassium: 4.4 mEq/L (ref 3.5–5.1)
Sodium: 139 mEq/L (ref 135–145)
Total Bilirubin: 0.3 mg/dL (ref 0.2–1.2)
Total Protein: 7.3 g/dL (ref 6.0–8.3)

## 2022-11-19 LAB — LIPID PANEL
Cholesterol: 356 mg/dL — ABNORMAL HIGH (ref 0–200)
HDL: 58.2 mg/dL (ref >39.00)
NonHDL: 297.31
Total CHOL/HDL Ratio: 6
Triglycerides: 257 mg/dL — ABNORMAL HIGH (ref 0.0–149.0)
VLDL: 51.4 mg/dL — ABNORMAL HIGH (ref 0.0–40.0)

## 2022-11-19 LAB — HEMOGLOBIN A1C: Hgb A1c MFr Bld: 6.1 % (ref 4.6–6.5)

## 2022-11-19 LAB — TSH: TSH: 2.44 u[IU]/mL (ref 0.35–5.50)

## 2022-11-19 MED ORDER — CLOTRIMAZOLE-BETAMETHASONE 1-0.05 % EX CREA: 1.0000 | TOPICAL_CREAM | Freq: Every day | CUTANEOUS | 2 refills | Status: DC

## 2022-11-19 NOTE — Assessment & Plan Note (Signed)
Chronic Will check A1c and recheck in 6 months She will work on lifestyle

## 2022-11-19 NOTE — Assessment & Plan Note (Addendum)
Chronic Started medication at her last visit-taking lisinopril-hydrochlorothiazide 5-6.25 mg daily and this is controlling her blood pressure based on her home readings Blood pressure elevated here today, but well-controlled at home so we will go with those readings Continue current dose as above Regular exercise, low-sodium diet, weight loss Follow-up in 6 months BMP today

## 2022-11-19 NOTE — Assessment & Plan Note (Signed)
Chronic LDL elevated Recheck lipid panel and recheck in 6 months Will work on lifestyle

## 2023-01-23 ENCOUNTER — Encounter: Payer: BC Managed Care – PPO | Admitting: Internal Medicine

## 2023-02-17 ENCOUNTER — Other Ambulatory Visit: Payer: Self-pay | Admitting: Internal Medicine

## 2023-05-22 ENCOUNTER — Encounter: Payer: BC Managed Care – PPO | Admitting: Internal Medicine

## 2023-07-10 ENCOUNTER — Other Ambulatory Visit (INDEPENDENT_AMBULATORY_CARE_PROVIDER_SITE_OTHER): Payer: BC Managed Care – PPO

## 2023-07-10 DIAGNOSIS — R7303 Prediabetes: Secondary | ICD-10-CM

## 2023-07-10 DIAGNOSIS — I10 Essential (primary) hypertension: Secondary | ICD-10-CM | POA: Diagnosis not present

## 2023-07-10 DIAGNOSIS — E782 Mixed hyperlipidemia: Secondary | ICD-10-CM

## 2023-07-10 LAB — CBC WITH DIFFERENTIAL/PLATELET
Basophils Absolute: 0 10*3/uL (ref 0.0–0.1)
Basophils Relative: 0.6 % (ref 0.0–3.0)
Eosinophils Absolute: 0.2 10*3/uL (ref 0.0–0.7)
Eosinophils Relative: 3.4 % (ref 0.0–5.0)
HCT: 42.1 % (ref 36.0–46.0)
Hemoglobin: 13.6 g/dL (ref 12.0–15.0)
Lymphocytes Relative: 31.3 % (ref 12.0–46.0)
Lymphs Abs: 1.6 10*3/uL (ref 0.7–4.0)
MCHC: 32.2 g/dL (ref 30.0–36.0)
MCV: 96.4 fl (ref 78.0–100.0)
Monocytes Absolute: 0.4 10*3/uL (ref 0.1–1.0)
Monocytes Relative: 7.5 % (ref 3.0–12.0)
Neutro Abs: 3 10*3/uL (ref 1.4–7.7)
Neutrophils Relative %: 57.2 % (ref 43.0–77.0)
Platelets: 253 10*3/uL (ref 150.0–400.0)
RBC: 4.37 Mil/uL (ref 3.87–5.11)
RDW: 14.5 % (ref 11.5–15.5)
WBC: 5.2 10*3/uL (ref 4.0–10.5)

## 2023-07-10 LAB — COMPREHENSIVE METABOLIC PANEL
ALT: 25 U/L (ref 0–35)
AST: 26 U/L (ref 0–37)
Albumin: 4.4 g/dL (ref 3.5–5.2)
Alkaline Phosphatase: 58 U/L (ref 39–117)
BUN: 8 mg/dL (ref 6–23)
CO2: 29 mEq/L (ref 19–32)
Calcium: 9.3 mg/dL (ref 8.4–10.5)
Chloride: 102 mEq/L (ref 96–112)
Creatinine, Ser: 0.72 mg/dL (ref 0.40–1.20)
GFR: 90.5 mL/min (ref >60.00)
Glucose, Bld: 100 mg/dL — ABNORMAL HIGH (ref 70–99)
Potassium: 3.8 mEq/L (ref 3.5–5.1)
Sodium: 139 mEq/L (ref 135–145)
Total Bilirubin: 0.7 mg/dL (ref 0.2–1.2)
Total Protein: 7.1 g/dL (ref 6.0–8.3)

## 2023-07-10 LAB — BASIC METABOLIC PANEL
BUN: 8 mg/dL (ref 6–23)
CO2: 29 mEq/L (ref 19–32)
Calcium: 9.3 mg/dL (ref 8.4–10.5)
Chloride: 102 mEq/L (ref 96–112)
Creatinine, Ser: 0.72 mg/dL (ref 0.40–1.20)
GFR: 90.5 mL/min (ref >60.00)
Glucose, Bld: 100 mg/dL — ABNORMAL HIGH (ref 70–99)
Potassium: 3.8 mEq/L (ref 3.5–5.1)
Sodium: 139 mEq/L (ref 135–145)

## 2023-07-10 LAB — HEMOGLOBIN A1C: Hgb A1c MFr Bld: 5.8 % (ref 4.6–6.5)

## 2023-07-10 LAB — LIPID PANEL
Cholesterol: 325 mg/dL — ABNORMAL HIGH (ref 0–200)
HDL: 52.7 mg/dL (ref >39.00)
NonHDL: 272.09
Total CHOL/HDL Ratio: 6
Triglycerides: 308 mg/dL — ABNORMAL HIGH (ref 0.0–149.0)
VLDL: 61.6 mg/dL — ABNORMAL HIGH (ref 0.0–40.0)

## 2023-07-10 LAB — TSH: TSH: 1.86 u[IU]/mL (ref 0.35–5.50)

## 2023-07-10 LAB — LDL CHOLESTEROL, DIRECT: Direct LDL: 218 mg/dL

## 2023-07-13 NOTE — Patient Instructions (Addendum)
An EKG was done.    Blood work was ordered.   The lab is on the first floor.    Medications changes include :       A referral was ordered and someone will call you to schedule an appointment.     Return in about 6 months (around 01/14/2024) for follow up.    Health Maintenance, Female Adopting a healthy lifestyle and getting preventive care are important in promoting health and wellness. Ask your health care provider about: The right schedule for you to have regular tests and exams. Things you can do on your own to prevent diseases and keep yourself healthy. What should I know about diet, weight, and exercise? Eat a healthy diet  Eat a diet that includes plenty of vegetables, fruits, low-fat dairy products, and lean protein. Do not eat a lot of foods that are high in solid fats, added sugars, or sodium. Maintain a healthy weight Body mass index (BMI) is used to identify weight problems. It estimates body fat based on height and weight. Your health care provider can help determine your BMI and help you achieve or maintain a healthy weight. Get regular exercise Get regular exercise. This is one of the most important things you can do for your health. Most adults should: Exercise for at least 150 minutes each week. The exercise should increase your heart rate and make you sweat (moderate-intensity exercise). Do strengthening exercises at least twice a week. This is in addition to the moderate-intensity exercise. Spend less time sitting. Even light physical activity can be beneficial. Watch cholesterol and blood lipids Have your blood tested for lipids and cholesterol at 61 years of age, then have this test every 5 years. Have your cholesterol levels checked more often if: Your lipid or cholesterol levels are high. You are older than 61 years of age. You are at high risk for heart disease. What should I know about cancer screening? Depending on your health history and family  history, you may need to have cancer screening at various ages. This may include screening for: Breast cancer. Cervical cancer. Colorectal cancer. Skin cancer. Lung cancer. What should I know about heart disease, diabetes, and high blood pressure? Blood pressure and heart disease High blood pressure causes heart disease and increases the risk of stroke. This is more likely to develop in people who have high blood pressure readings or are overweight. Have your blood pressure checked: Every 3-5 years if you are 85-68 years of age. Every year if you are 4 years old or older. Diabetes Have regular diabetes screenings. This checks your fasting blood sugar level. Have the screening done: Once every three years after age 54 if you are at a normal weight and have a low risk for diabetes. More often and at a younger age if you are overweight or have a high risk for diabetes. What should I know about preventing infection? Hepatitis B If you have a higher risk for hepatitis B, you should be screened for this virus. Talk with your health care provider to find out if you are at risk for hepatitis B infection. Hepatitis C Testing is recommended for: Everyone born from [birth year] through 1964. Anyone with known risk factors for hepatitis C. Sexually transmitted infections (STIs) Get screened for STIs, including gonorrhea and chlamydia, if: You are sexually active and are younger than 61 years of age. You are older than 61 years of age and your health care provider tells you that you are  at risk for this type of infection. Your sexual activity has changed since you were last screened, and you are at increased risk for chlamydia or gonorrhea. Ask your health care provider if you are at risk. Ask your health care provider about whether you are at high risk for HIV. Your health care provider may recommend a prescription medicine to help prevent HIV infection. If you choose to take medicine to prevent HIV, you  should first get tested for HIV. You should then be tested every 3 months for as long as you are taking the medicine. Pregnancy If you are about to stop having your period (premenopausal) and you may become pregnant, seek counseling before you get pregnant. Take 400 to 800 micrograms (mcg) of folic acid every day if you become pregnant. Ask for birth control (contraception) if you want to prevent pregnancy. Osteoporosis and menopause Osteoporosis is a disease in which the bones lose minerals and strength with aging. This can result in bone fractures. If you are 36 years old or older, or if you are at risk for osteoporosis and fractures, ask your health care provider if you should: Be screened for bone loss. Take a calcium or vitamin D supplement to lower your risk of fractures. Be given hormone replacement therapy (HRT) to treat symptoms of menopause. Follow these instructions at home: Alcohol use Do not drink alcohol if: Your health care provider tells you not to drink. You are pregnant, may be pregnant, or are planning to become pregnant. If you drink alcohol: Limit how much you have to: 0-1 drink a day. Know how much alcohol is in your drink. In the [place] one drink equals one 12 oz bottle of beer (355 mL), one 5 oz glass of wine (148 mL), or one 1 oz glass of hard liquor (44 mL). Lifestyle Do not use any products that contain nicotine or tobacco. These products include cigarettes, chewing tobacco, and vaping devices, such as e-cigarettes. If you need help quitting, ask your health care provider. Do not use street drugs. Do not share needles. Ask your health care provider for help if you need support or information about quitting drugs. General instructions Schedule regular health, dental, and eye exams. Stay current with your vaccines. Tell your health care provider if: You often feel depressed. You have ever been abused or do not feel safe at home. Summary Adopting a healthy  lifestyle and getting preventive care are important in promoting health and wellness. Follow your health care provider's instructions about healthy diet, exercising, and getting tested or screened for diseases. Follow your health care provider's instructions on monitoring your cholesterol and blood pressure. This information is not intended to replace advice given to you by your health care provider. Make sure you discuss any questions you have with your health care provider. Document Revised: 04/16/2021 Document Reviewed: 04/16/2021 Elsevier Patient Education  2024 [employer].

## 2023-07-13 NOTE — Progress Notes (Unsigned)
Subjective:    Patient ID: Jody Thompson, female    DOB: Apr 18, 1961, 62 y.o.   MRN: [id number]      HPI Jody Thompson is here for a Physical exam and her chronic medical problems.   Had blood work done prior.  No concerns.  Still not exercising as she should.  Trying to do better with diet.   Medications and allergies reviewed with patient and updated if appropriate.  Current Outpatient Medications on File Prior to Visit  Medication Sig Dispense Refill   clotrimazole-betamethasone (LOTRISONE) cream Apply 1 Application topically daily. 30 g 2   hydrocortisone 2.5 % cream Apply topically 2 (two) times daily. 30 g 0   lisinopril-hydrochlorothiazide (ZESTORETIC) 10-12.5 MG tablet TAKE 1 TABLET BY MOUTH DAILY 90 tablet 2   Multiple Vitamins-Minerals (MULTIVITAMIN PO) Take by mouth daily.     No current facility-administered medications on file prior to visit.    Review of Systems  Constitutional:  Negative for fever.  Eyes:  Negative for visual disturbance.  Respiratory:  Negative for cough, shortness of breath and wheezing.   Cardiovascular:  Negative for chest pain, palpitations and leg swelling.  Gastrointestinal:  Negative for abdominal pain, blood in stool, constipation and diarrhea.       Occ gerd  Genitourinary:  Negative for dysuria.  Musculoskeletal:  Negative for arthralgias and back pain.  Skin:  Negative for rash.  Neurological:  Negative for dizziness, light-headedness and headaches.  Psychiatric/Behavioral:  Negative for dysphoric mood. The patient is not nervous/anxious.        Objective:   Vitals:   07/14/23 0850  BP: 138/72  Pulse: 80  Temp: 98.6 F (37 C)  SpO2: 98%   Filed Weights   Body mass index is 29.87 kg/m.  BP Readings from Last 3 Encounters:  07/14/23 138/72  11/19/22 (!) 140/80  10/25/22 (!) 150/86    Wt Readings from Last 3 Encounters:  10/25/22 174 lb (78.9 kg)  01/17/22 171 lb (77.6 kg)  01/15/21 172 lb 9.6 oz (78.3 kg)        Physical Exam Constitutional: She appears well-developed and well-nourished. No distress.  HENT:  Head: Normocephalic and atraumatic.  Right Ear: External ear normal. Normal ear canal and TM Left Ear: External ear normal.  Normal ear canal and TM Mouth/Throat: Oropharynx is clear and moist.  Eyes: Conjunctivae normal.  Neck: Neck supple. No tracheal deviation present. No thyromegaly present.  No carotid bruit  Cardiovascular: Normal rate, regular rhythm and normal heart sounds.   No murmur heard.  No edema. Pulmonary/Chest: Effort normal and breath sounds normal. No respiratory distress. She has no wheezes. She has no rales.  Breast: deferred   Abdominal: Soft. She exhibits no distension. There is no tenderness.  Lymphadenopathy: She has no cervical adenopathy.  Skin: Skin is warm and dry. She is not diaphoretic.  Psychiatric: She has a normal mood and affect. Her behavior is normal.     Lab Results  Component Value Date   WBC 5.2 07/10/2023   HGB 13.6 07/10/2023   HCT 42.1 07/10/2023   PLT 253.0 07/10/2023   GLUCOSE 100 (H) 07/10/2023   GLUCOSE 100 (H) 07/10/2023   CHOL 325 (H) 07/10/2023   TRIG 308.0 (H) 07/10/2023   HDL 52.70 07/10/2023   LDLDIRECT 218.0 07/10/2023   LDLCALC 228 (H) 01/23/2022   ALT 25 07/10/2023   AST 26 07/10/2023   NA 139 07/10/2023   NA 139 07/10/2023   K 3.8 07/10/2023  K 3.8 07/10/2023   CL 102 07/10/2023   CL 102 07/10/2023   CREATININE 0.72 07/10/2023   CREATININE 0.72 07/10/2023   BUN 8 07/10/2023   BUN 8 07/10/2023   CO2 29 07/10/2023   CO2 29 07/10/2023   TSH 1.86 07/10/2023   HGBA1C 5.8 07/10/2023         Assessment & Plan:   Physical exam: Screening blood work  ordered Exercise  none-encouraged regular exercise Weight-encouraged working on weight loss Substance abuse  none   Reviewed recommended immunizations.   Health Maintenance  Topic Date Due   COVID-19 Vaccine (5 - 2023-24 season) 08/09/2022    MAMMOGRAM  03/17/2023   INFLUENZA VACCINE  03/08/2024 (Originally 07/10/2023)   Hepatitis C Screening  01/11/2049 (Originally 07/04/1980)   HIV Screening  01/11/2049 (Originally 07/04/1977)   DTaP/Tdap/Td (2 - Td or Tdap) 11/18/2023   PAP SMEAR-Modifier  12/24/2023   Colonoscopy  02/15/2027   HPV VACCINES  Aged Out   Zoster Vaccines- Shingrix  Discontinued          See Problem List for Assessment and Plan of chronic medical problems.

## 2023-07-14 ENCOUNTER — Ambulatory Visit (INDEPENDENT_AMBULATORY_CARE_PROVIDER_SITE_OTHER): Payer: BC Managed Care – PPO | Admitting: Internal Medicine

## 2023-07-14 ENCOUNTER — Encounter: Payer: Self-pay | Admitting: Internal Medicine

## 2023-07-14 VITALS — BP 138/72 | HR 80 | Temp 98.6°F | Ht 64.0 in

## 2023-07-14 DIAGNOSIS — E782 Mixed hyperlipidemia: Secondary | ICD-10-CM

## 2023-07-14 DIAGNOSIS — I10 Essential (primary) hypertension: Secondary | ICD-10-CM

## 2023-07-14 DIAGNOSIS — Z0000 Encounter for general adult medical examination without abnormal findings: Secondary | ICD-10-CM | POA: Diagnosis not present

## 2023-07-14 DIAGNOSIS — R7303 Prediabetes: Secondary | ICD-10-CM

## 2023-07-14 DIAGNOSIS — M8580 Other specified disorders of bone density and structure, unspecified site: Secondary | ICD-10-CM | POA: Insufficient documentation

## 2023-07-14 NOTE — Assessment & Plan Note (Addendum)
Chronic LDL elevated-likely genetic LDL slightly better, but still more than 200 Regular exercise, healthy diet, weight loss encouraged Discussed coronary artery calcium score-she will think about this Consider starting statin based on LDL > 200-she prefers to hold off on any medication No strong family history of CAD, but mom had a CVA, brothers and sisters with hyperlipidemia

## 2023-07-14 NOTE — Assessment & Plan Note (Addendum)
Chronic A1c improved Low sugar / carb diet Stressed regular exercise

## 2023-07-14 NOTE — Assessment & Plan Note (Addendum)
Chronic Controlled Blood work reviewed-normal CBC, CMP Regular exercise, low-sodium diet, weight loss Continue lisinopril-hydrochlorothiazide 10-12.5 mg daily  EKG - SR at 82 bpm with pacs, owtherwise normal EKG.  No change compared to EKG from 2019

## 2023-07-31 DIAGNOSIS — Z01419 Encounter for gynecological examination (general) (routine) without abnormal findings: Secondary | ICD-10-CM | POA: Diagnosis not present

## 2023-07-31 DIAGNOSIS — Z1231 Encounter for screening mammogram for malignant neoplasm of breast: Secondary | ICD-10-CM | POA: Diagnosis not present

## 2023-09-19 ENCOUNTER — Other Ambulatory Visit: Payer: Self-pay | Admitting: Internal Medicine

## 2023-12-29 ENCOUNTER — Other Ambulatory Visit: Payer: Self-pay | Admitting: Internal Medicine

## 2024-01-14 ENCOUNTER — Ambulatory Visit: Payer: BC Managed Care – PPO | Admitting: Internal Medicine

## 2024-06-18 ENCOUNTER — Other Ambulatory Visit: Payer: Self-pay | Admitting: Internal Medicine

## 2024-07-05 ENCOUNTER — Telehealth: Payer: Self-pay | Admitting: Internal Medicine

## 2024-07-05 DIAGNOSIS — I10 Essential (primary) hypertension: Secondary | ICD-10-CM

## 2024-07-05 DIAGNOSIS — R7303 Prediabetes: Secondary | ICD-10-CM

## 2024-07-05 DIAGNOSIS — E782 Mixed hyperlipidemia: Secondary | ICD-10-CM

## 2024-07-05 NOTE — Telephone Encounter (Signed)
 Pt has CPE 8/22, lab appt 8/15. Current lab orders will expire 07/13/2024.

## 2024-07-14 ENCOUNTER — Encounter: Payer: BC Managed Care – PPO | Admitting: Internal Medicine

## 2024-07-23 ENCOUNTER — Other Ambulatory Visit (INDEPENDENT_AMBULATORY_CARE_PROVIDER_SITE_OTHER)

## 2024-07-23 DIAGNOSIS — I10 Essential (primary) hypertension: Secondary | ICD-10-CM | POA: Diagnosis not present

## 2024-07-23 DIAGNOSIS — E782 Mixed hyperlipidemia: Secondary | ICD-10-CM

## 2024-07-23 DIAGNOSIS — R7303 Prediabetes: Secondary | ICD-10-CM

## 2024-07-23 LAB — COMPREHENSIVE METABOLIC PANEL WITH GFR
ALT: 20 U/L (ref 0–35)
AST: 21 U/L (ref 0–37)
Albumin: 4.2 g/dL (ref 3.5–5.2)
Alkaline Phosphatase: 60 U/L (ref 39–117)
BUN: 11 mg/dL (ref 6–23)
CO2: 28 meq/L (ref 19–32)
Calcium: 8.5 mg/dL (ref 8.4–10.5)
Chloride: 103 meq/L (ref 96–112)
Creatinine, Ser: 0.64 mg/dL (ref 0.40–1.20)
GFR: 94.96 mL/min (ref >60.00)
Glucose, Bld: 90 mg/dL (ref 70–99)
Potassium: 4 meq/L (ref 3.5–5.1)
Sodium: 139 meq/L (ref 135–145)
Total Bilirubin: 0.4 mg/dL (ref 0.2–1.2)
Total Protein: 6.8 g/dL (ref 6.0–8.3)

## 2024-07-23 LAB — CBC WITH DIFFERENTIAL/PLATELET
Basophils Absolute: 0.1 K/uL (ref 0.0–0.1)
Basophils Relative: 1.2 % (ref 0.0–3.0)
Eosinophils Absolute: 0.2 K/uL (ref 0.0–0.7)
Eosinophils Relative: 4.5 % (ref 0.0–5.0)
HCT: 41.3 % (ref 36.0–46.0)
Hemoglobin: 13.6 g/dL (ref 12.0–15.0)
Lymphocytes Relative: 32.2 % (ref 12.0–46.0)
Lymphs Abs: 1.5 K/uL (ref 0.7–4.0)
MCHC: 33 g/dL (ref 30.0–36.0)
MCV: 93.7 fl (ref 78.0–100.0)
Monocytes Absolute: 0.4 K/uL (ref 0.1–1.0)
Monocytes Relative: 8 % (ref 3.0–12.0)
Neutro Abs: 2.5 K/uL (ref 1.4–7.7)
Neutrophils Relative %: 54.1 % (ref 43.0–77.0)
Platelets: 244 K/uL (ref 150.0–400.0)
RBC: 4.41 Mil/uL (ref 3.87–5.11)
RDW: 13.8 % (ref 11.5–15.5)
WBC: 4.6 K/uL (ref 4.0–10.5)

## 2024-07-23 LAB — LIPID PANEL
Cholesterol: 305 mg/dL — ABNORMAL HIGH (ref 0–200)
HDL: 51.6 mg/dL (ref >39.00)
LDL Cholesterol: 222 mg/dL — ABNORMAL HIGH (ref 0–99)
NonHDL: 253.15
Total CHOL/HDL Ratio: 6
Triglycerides: 158 mg/dL — ABNORMAL HIGH (ref 0.0–149.0)
VLDL: 31.6 mg/dL (ref 0.0–40.0)

## 2024-07-23 LAB — TSH: TSH: 2.12 u[IU]/mL (ref 0.35–5.50)

## 2024-07-23 LAB — HEMOGLOBIN A1C: Hgb A1c MFr Bld: 5.9 % (ref 4.6–6.5)

## 2024-07-29 ENCOUNTER — Encounter: Payer: Self-pay | Admitting: Internal Medicine

## 2024-07-29 NOTE — Patient Instructions (Addendum)
 Tetanus vaccine today.    Blood work was ordered for your next visit.       Medications changes include :   None     Return in about 6 months (around 01/30/2025) for follow up.    Health Maintenance, Female Adopting a healthy lifestyle and getting preventive care are important in promoting health and wellness. Ask your health care provider about: The right schedule for you to have regular tests and exams. Things you can do on your own to prevent diseases and keep yourself healthy. What should I know about diet, weight, and exercise? Eat a healthy diet  Eat a diet that includes plenty of vegetables, fruits, low-fat dairy products, and lean protein. Do not eat a lot of foods that are high in solid fats, added sugars, or sodium. Maintain a healthy weight Body mass index (BMI) is used to identify weight problems. It estimates body fat based on height and weight. Your health care provider can help determine your BMI and help you achieve or maintain a healthy weight. Get regular exercise Get regular exercise. This is one of the most important things you can do for your health. Most adults should: Exercise for at least 150 minutes each week. The exercise should increase your heart rate and make you sweat (moderate-intensity exercise). Do strengthening exercises at least twice a week. This is in addition to the moderate-intensity exercise. Spend less time sitting. Even light physical activity can be beneficial. Watch cholesterol and blood lipids Have your blood tested for lipids and cholesterol at 62 years of age, then have this test every 5 years. Have your cholesterol levels checked more often if: Your lipid or cholesterol levels are high. You are older than 62 years of age. You are at high risk for heart disease. What should I know about cancer screening? Depending on your health history and family history, you may need to have cancer screening at various ages. This may  include screening for: Breast cancer. Cervical cancer. Colorectal cancer. Skin cancer. Lung cancer. What should I know about heart disease, diabetes, and high blood pressure? Blood pressure and heart disease High blood pressure causes heart disease and increases the risk of stroke. This is more likely to develop in people who have high blood pressure readings or are overweight. Have your blood pressure checked: Every 3-5 years if you are 80-25 years of age. Every year if you are 79 years old or older. Diabetes Have regular diabetes screenings. This checks your fasting blood sugar level. Have the screening done: Once every three years after age 66 if you are at a normal weight and have a low risk for diabetes. More often and at a younger age if you are overweight or have a high risk for diabetes. What should I know about preventing infection? Hepatitis B If you have a higher risk for hepatitis B, you should be screened for this virus. Talk with your health care provider to find out if you are at risk for hepatitis B infection. Hepatitis C Testing is recommended for: Everyone born from [birth year] through 1965. Anyone with known risk factors for hepatitis C. Sexually transmitted infections (STIs) Get screened for STIs, including gonorrhea and chlamydia, if: You are sexually active and are younger than 62 years of age. You are older than 62 years of age and your health care provider tells you that you are at risk for this type of infection. Your sexual activity has changed since you were last screened, and  you are at increased risk for chlamydia or gonorrhea. Ask your health care provider if you are at risk. Ask your health care provider about whether you are at high risk for HIV. Your health care provider may recommend a prescription medicine to help prevent HIV infection. If you choose to take medicine to prevent HIV, you should first get tested for HIV. You should then be tested every 3 months  for as long as you are taking the medicine. Pregnancy If you are about to stop having your period (premenopausal) and you may become pregnant, seek counseling before you get pregnant. Take 400 to 800 micrograms (mcg) of folic acid every day if you become pregnant. Ask for birth control (contraception) if you want to prevent pregnancy. Osteoporosis and menopause Osteoporosis is a disease in which the bones lose minerals and strength with aging. This can result in bone fractures. If you are 70 years old or older, or if you are at risk for osteoporosis and fractures, ask your health care provider if you should: Be screened for bone loss. Take a calcium or vitamin D supplement to lower your risk of fractures. Be given hormone replacement therapy (HRT) to treat symptoms of menopause. Follow these instructions at home: Alcohol use Do not drink alcohol if: Your health care provider tells you not to drink. You are pregnant, may be pregnant, or are planning to become pregnant. If you drink alcohol: Limit how much you have to: 0-1 drink a day. Know how much alcohol is in your drink. In the [place] one drink equals one 12 oz bottle of beer (355 mL), one 5 oz glass of wine (148 mL), or one 1 oz glass of hard liquor (44 mL). Lifestyle Do not use any products that contain nicotine or tobacco. These products include cigarettes, chewing tobacco, and vaping devices, such as e-cigarettes. If you need help quitting, ask your health care provider. Do not use street drugs. Do not share needles. Ask your health care provider for help if you need support or information about quitting drugs. General instructions Schedule regular health, dental, and eye exams. Stay current with your vaccines. Tell your health care provider if: You often feel depressed. You have ever been abused or do not feel safe at home. Summary Adopting a healthy lifestyle and getting preventive care are important in promoting health and  wellness. Follow your health care provider's instructions about healthy diet, exercising, and getting tested or screened for diseases. Follow your health care provider's instructions on monitoring your cholesterol and blood pressure. This information is not intended to replace advice given to you by your health care provider. Make sure you discuss any questions you have with your health care provider. Document Revised: 04/16/2021 Document Reviewed: 04/16/2021 Elsevier Patient Education  2024 [employer].

## 2024-07-29 NOTE — Progress Notes (Unsigned)
 Subjective:    Patient ID: Jody Thompson, female    DOB: 1961-06-13, 63 y.o.   MRN: [id number]      HPI Jody Thompson is here for a Physical exam and her chronic medical problems.   Had blood work done.     Medications and allergies reviewed with patient and updated if appropriate.  Current Outpatient Medications on File Prior to Visit  Medication Sig Dispense Refill   clotrimazole -betamethasone  (LOTRISONE ) cream Apply 1 Application topically daily. 30 g 2   hydrocortisone  2.5 % cream APPLY TOPICALLY TO THE AFFECTED AREA TWICE DAILY 30 g 0   lisinopril -hydrochlorothiazide  (ZESTORETIC ) 10-12.5 MG tablet TAKE 1 TABLET BY MOUTH DAILY 90 tablet 1   Multiple Vitamins-Minerals (MULTIVITAMIN PO) Take by mouth daily.     No current facility-administered medications on file prior to visit.    Review of Systems     Objective:  There were no vitals filed for this visit. There were no vitals filed for this visit. There is no height or weight on file to calculate BMI.  BP Readings from Last 3 Encounters:  07/14/23 138/72  11/19/22 (!) 140/80  10/25/22 (!) 150/86    Wt Readings from Last 3 Encounters:  10/25/22 174 lb (78.9 kg)  01/17/22 171 lb (77.6 kg)  01/15/21 172 lb 9.6 oz (78.3 kg)       Physical Exam Constitutional: She appears well-developed and well-nourished. No distress.  HENT:  Head: Normocephalic and atraumatic.  Right Ear: External ear normal. Normal ear canal and TM Left Ear: External ear normal.  Normal ear canal and TM Mouth/Throat: Oropharynx is clear and moist.  Eyes: Conjunctivae normal.  Neck: Neck supple. No tracheal deviation present. No thyromegaly present.  No carotid bruit  Cardiovascular: Normal rate, regular rhythm and normal heart sounds.   No murmur heard.  No edema. Pulmonary/Chest: Effort normal and breath sounds normal. No respiratory distress. She has no wheezes. She has no rales.  Breast: deferred   Abdominal: Soft. She exhibits no  distension. There is no tenderness.  Lymphadenopathy: She has no cervical adenopathy.  Skin: Skin is warm and dry. She is not diaphoretic.  Psychiatric: She has a normal mood and affect. Her behavior is normal.     Lab Results  Component Value Date   WBC 4.6 07/23/2024   HGB 13.6 07/23/2024   HCT 41.3 07/23/2024   PLT 244.0 07/23/2024   GLUCOSE 90 07/23/2024   CHOL 305 (H) 07/23/2024   TRIG 158.0 (H) 07/23/2024   HDL 51.60 07/23/2024   LDLDIRECT 218.0 07/10/2023   LDLCALC 222 (H) 07/23/2024   ALT 20 07/23/2024   AST 21 07/23/2024   NA 139 07/23/2024   K 4.0 07/23/2024   CL 103 07/23/2024   CREATININE 0.64 07/23/2024   BUN 11 07/23/2024   CO2 28 07/23/2024   TSH 2.12 07/23/2024   HGBA1C 5.9 07/23/2024         Assessment & Plan:   Physical exam: Screening blood work  ordered Exercise   Weight   Substance abuse  none   Reviewed recommended immunizations.   Health Maintenance  Topic Date Due   Pneumococcal Vaccine: 50+ Years (1 of 1 - PCV) Never done   Cervical Cancer Screening (HPV/Pap Cotest)  12/23/2021   MAMMOGRAM  03/17/2023   COVID-19 Vaccine (5 - 2024-25 season) 08/10/2023   DTaP/Tdap/Td (2 - Td or Tdap) 11/18/2023   INFLUENZA VACCINE  07/09/2024   Hepatitis C Screening  01/11/2049 (Originally 07/04/1980)  HIV Screening  01/11/2049 (Originally 07/04/1977)   Colonoscopy  02/15/2027   Hepatitis B Vaccines 19-59 Average Risk  Aged Out   HPV VACCINES  Aged Out   Meningococcal B Vaccine  Aged Out   Zoster Vaccines- Shingrix  Discontinued          See Problem List for Assessment and Plan of chronic medical problems.

## 2024-07-30 ENCOUNTER — Ambulatory Visit: Admitting: Internal Medicine

## 2024-07-30 VITALS — BP 138/80 | HR 83 | Temp 98.2°F | Ht 63.0 in | Wt 173.0 lb

## 2024-07-30 DIAGNOSIS — Z0000 Encounter for general adult medical examination without abnormal findings: Secondary | ICD-10-CM

## 2024-07-30 DIAGNOSIS — E782 Mixed hyperlipidemia: Secondary | ICD-10-CM | POA: Diagnosis not present

## 2024-07-30 DIAGNOSIS — M8580 Other specified disorders of bone density and structure, unspecified site: Secondary | ICD-10-CM

## 2024-07-30 DIAGNOSIS — Z23 Encounter for immunization: Secondary | ICD-10-CM

## 2024-07-30 DIAGNOSIS — I10 Essential (primary) hypertension: Secondary | ICD-10-CM

## 2024-07-30 DIAGNOSIS — R7303 Prediabetes: Secondary | ICD-10-CM | POA: Diagnosis not present

## 2024-07-30 NOTE — Assessment & Plan Note (Addendum)
 Chronic BP borderline here Controlled at home Blood work reviewed-normal CBC, CMP Regular exercise, low-sodium diet, weight loss Continue lisinopril -hydrochlorothiazide  10-12.5 mg 1/2 pill daily

## 2024-07-30 NOTE — Addendum Note (Signed)
 Addended by: GEOFM GLADE PARAS on: 07/30/2024 01:58 PM   Modules accepted: Level of Service

## 2024-07-30 NOTE — Assessment & Plan Note (Signed)
 Chronic Lab Results  Component Value Date   HGBA1C 5.9 07/23/2024    Low sugar / carb diet Stressed regular exercise

## 2024-07-30 NOTE — Assessment & Plan Note (Signed)
 Chronic DEXA with GYN-Dr. Latisha

## 2024-07-30 NOTE — Assessment & Plan Note (Addendum)
 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  Her sister just had it and had a coronary artery calcium of 0 Regular exercise, healthy diet encouraged No strong family history of CAD, but mom had a CVA, brothers and sisters with hyperlipidemia

## 2024-08-02 DIAGNOSIS — Z01419 Encounter for gynecological examination (general) (routine) without abnormal findings: Secondary | ICD-10-CM | POA: Diagnosis not present

## 2024-08-02 DIAGNOSIS — Z13820 Encounter for screening for osteoporosis: Secondary | ICD-10-CM | POA: Diagnosis not present

## 2024-08-02 DIAGNOSIS — Z1231 Encounter for screening mammogram for malignant neoplasm of breast: Secondary | ICD-10-CM | POA: Diagnosis not present

## 2024-08-02 DIAGNOSIS — Z6830 Body mass index (BMI) 30.0-30.9, adult: Secondary | ICD-10-CM | POA: Diagnosis not present

## 2024-08-02 LAB — HM MAMMOGRAPHY

## 2024-10-18 DIAGNOSIS — L820 Inflamed seborrheic keratosis: Secondary | ICD-10-CM | POA: Diagnosis not present

## 2024-10-18 DIAGNOSIS — L570 Actinic keratosis: Secondary | ICD-10-CM | POA: Diagnosis not present

## 2024-10-18 DIAGNOSIS — L821 Other seborrheic keratosis: Secondary | ICD-10-CM | POA: Diagnosis not present

## 2024-10-18 DIAGNOSIS — D1801 Hemangioma of skin and subcutaneous tissue: Secondary | ICD-10-CM | POA: Diagnosis not present

## 2024-10-18 DIAGNOSIS — L814 Other melanin hyperpigmentation: Secondary | ICD-10-CM | POA: Diagnosis not present

## 2024-10-18 DIAGNOSIS — L218 Other seborrheic dermatitis: Secondary | ICD-10-CM | POA: Diagnosis not present

## 2024-10-28 ENCOUNTER — Encounter: Payer: Self-pay | Admitting: Internal Medicine

## 2024-12-14 ENCOUNTER — Other Ambulatory Visit: Payer: Self-pay | Admitting: Internal Medicine

## 2025-08-01 ENCOUNTER — Encounter: Admitting: Internal Medicine
